# Patient Record
Sex: Female | Born: 1985
Health system: Southern US, Community
[De-identification: ages and names within clinical notes are randomized; demographics above are authoritative.]

## PROBLEM LIST (undated history)

## (undated) ENCOUNTER — Inpatient Hospital Stay (HOSPITAL_COMMUNITY): Payer: Self-pay

## (undated) DIAGNOSIS — Z98891 History of uterine scar from previous surgery: Secondary | ICD-10-CM

## (undated) DIAGNOSIS — R12 Heartburn: Secondary | ICD-10-CM

## (undated) DIAGNOSIS — J302 Other seasonal allergic rhinitis: Secondary | ICD-10-CM

## (undated) DIAGNOSIS — O26899 Other specified pregnancy related conditions, unspecified trimester: Secondary | ICD-10-CM

## (undated) DIAGNOSIS — J45909 Unspecified asthma, uncomplicated: Secondary | ICD-10-CM

---

## 2008-12-10 ENCOUNTER — Emergency Department (HOSPITAL_COMMUNITY): Admission: EM | Admit: 2008-12-10 | Discharge: 2008-12-10 | Payer: Self-pay | Admitting: Emergency Medicine

## 2010-09-11 ENCOUNTER — Other Ambulatory Visit (HOSPITAL_COMMUNITY): Payer: Self-pay | Admitting: Obstetrics and Gynecology

## 2010-09-11 DIAGNOSIS — Z3689 Encounter for other specified antenatal screening: Secondary | ICD-10-CM

## 2010-09-13 ENCOUNTER — Ambulatory Visit (HOSPITAL_COMMUNITY)
Admission: RE | Admit: 2010-09-13 | Discharge: 2010-09-13 | Disposition: A | Discharge status: Home / Self Care | Payer: Managed Care, Other (non HMO) | Source: Ambulatory Visit | Attending: Obstetrics and Gynecology | Admitting: Obstetrics and Gynecology

## 2010-09-13 ENCOUNTER — Ambulatory Visit (HOSPITAL_COMMUNITY): Payer: Self-pay

## 2010-09-13 DIAGNOSIS — Z3689 Encounter for other specified antenatal screening: Secondary | ICD-10-CM

## 2010-09-13 DIAGNOSIS — O358XX0 Maternal care for other (suspected) fetal abnormality and damage, not applicable or unspecified: Secondary | ICD-10-CM | POA: Insufficient documentation

## 2010-09-13 DIAGNOSIS — Z363 Encounter for antenatal screening for malformations: Secondary | ICD-10-CM | POA: Insufficient documentation

## 2010-09-13 DIAGNOSIS — Z1389 Encounter for screening for other disorder: Secondary | ICD-10-CM | POA: Insufficient documentation

## 2010-11-09 ENCOUNTER — Other Ambulatory Visit: Payer: Self-pay

## 2010-11-09 ENCOUNTER — Inpatient Hospital Stay (HOSPITAL_COMMUNITY)
Admission: AD | Admit: 2010-11-09 | Discharge: 2010-11-09 | Disposition: A | Discharge status: Home / Self Care | Payer: Managed Care, Other (non HMO) | Source: Ambulatory Visit | Attending: Obstetrics and Gynecology | Admitting: Obstetrics and Gynecology

## 2010-11-09 ENCOUNTER — Encounter (HOSPITAL_COMMUNITY): Payer: Self-pay

## 2010-11-09 DIAGNOSIS — O9989 Other specified diseases and conditions complicating pregnancy, childbirth and the puerperium: Secondary | ICD-10-CM

## 2010-11-09 DIAGNOSIS — R55 Syncope and collapse: Secondary | ICD-10-CM

## 2010-11-09 LAB — WET PREP, GENITAL
Clue Cells Wet Prep HPF POC: NONE SEEN
Trich, Wet Prep: NONE SEEN
Yeast Wet Prep HPF POC: NONE SEEN

## 2010-11-09 LAB — COMPREHENSIVE METABOLIC PANEL
ALT: 48 U/L — ABNORMAL HIGH (ref 0–35)
AST: 30 U/L (ref 0–37)
Albumin: 2.7 g/dL — ABNORMAL LOW (ref 3.5–5.2)
Alkaline Phosphatase: 65 U/L (ref 39–117)
BUN: 4 mg/dL — ABNORMAL LOW (ref 6–23)
CO2: 23 mEq/L (ref 19–32)
Calcium: 8.9 mg/dL (ref 8.4–10.5)
Chloride: 103 mEq/L (ref 96–112)
Creatinine, Ser: <0.47 mg/dL — ABNORMAL LOW (ref 0.50–1.10)
Glucose, Bld: 82 mg/dL (ref 70–99)
Potassium: 3.6 mEq/L (ref 3.5–5.1)
Sodium: 134 mEq/L — ABNORMAL LOW (ref 135–145)
Total Bilirubin: 0.2 mg/dL — ABNORMAL LOW (ref 0.3–1.2)
Total Protein: 6.8 g/dL (ref 6.0–8.3)

## 2010-11-09 LAB — CBC
HCT: 33.5 % — ABNORMAL LOW (ref 36.0–46.0)
Hemoglobin: 11.8 g/dL — ABNORMAL LOW (ref 12.0–15.0)
MCH: 31.1 pg (ref 26.0–34.0)
MCHC: 35.2 g/dL (ref 30.0–36.0)
MCV: 88.4 fL (ref 78.0–100.0)
Platelets: 234 10*3/uL (ref 150–400)
RBC: 3.79 MIL/uL — ABNORMAL LOW (ref 3.87–5.11)
RDW: 12.7 % (ref 11.5–15.5)
WBC: 9.7 10*3/uL (ref 4.0–10.5)

## 2010-11-09 NOTE — ED Provider Notes (Signed)
History     Chief Complaint  Patient presents with  . Loss of Consciousness    3 times in 3 days out for seconds has history  . Rupture of Membranes   HPIpt is 26.[redacted] weeks pregnant and complains of leaking clear fluid a couple of hours ago @ 1500.  Pt denies cramping.  Pt has also had 3 episodes of fainting or near fainting- not related to dehydration or not eating.      Past Medical History  Diagnosis Date  . No pertinent past medical history     Past Surgical History  Procedure Date  . No past surgeries     No family history on file.  History  Substance Use Topics  . Smoking status: Never Smoker   . Smokeless tobacco: Not on file  . Alcohol Use: No    Allergies: Allergies not on file  No prescriptions prior to admission    ROS Physical Exam   Blood pressure 117/84, pulse 93, temperature 98.3 F (36.8 C), temperature source Oral, resp. rate 18, height 5\' 3"  (1.6 m), weight 154 lb 12.8 oz (70.217 kg).  Physical Exam Pt alert and oriented in no acute distress. abd gravid, soft nontender  MAU Course  Procedures Pelvic exam performed- no pooling of fluid; small amount of white thick discharge in vault; cervix closed, clean.   Dr. Ellyn Hack called- since pt has had a history of syncope and near syncope, will do CBC, CMET and EKG EKG normal sinus rhythm Lab results reviewed with Dr. Ellyn Hack- ALT slightly elevated and will have pt have rechecked in the office Pt may be discharged hom MDM  Assessment and Plan  Pregnancy Syncope  Latonda Larrivee 11/09/2010, 5:54 PM

## 2010-11-09 NOTE — Progress Notes (Signed)
Pt presents to MAU with complaints of "passing out", and leakage of fluid. Pt states she has not felt like her self the past few days. States she has been nauseous, and today around 1500 she felt a leakage of fluid from her vagina.

## 2010-11-09 NOTE — Progress Notes (Signed)
Leaking clear fluid a couple of hours of ago, no cramping, sores on tongue, 3 episodes of fainting in past 3 days (has history of)

## 2011-01-15 LAB — STREP B DNA PROBE: GBS: NEGATIVE

## 2011-02-14 ENCOUNTER — Encounter (HOSPITAL_COMMUNITY): Payer: Self-pay | Admitting: *Deleted

## 2011-02-14 ENCOUNTER — Inpatient Hospital Stay (HOSPITAL_COMMUNITY)
Admission: AD | Admit: 2011-02-14 | Discharge: 2011-02-18 | DRG: 766 | Disposition: A | Discharge status: Home / Self Care | Payer: Managed Care, Other (non HMO) | Source: Ambulatory Visit | Attending: Obstetrics and Gynecology | Admitting: Obstetrics and Gynecology

## 2011-02-14 LAB — CBC
HCT: 39.8 % (ref 36.0–46.0)
Hemoglobin: 13.9 g/dL (ref 12.0–15.0)
MCH: 31.2 pg (ref 26.0–34.0)
MCHC: 34.9 g/dL (ref 30.0–36.0)
MCV: 89.2 fL (ref 78.0–100.0)
Platelets: 243 10*3/uL (ref 150–400)
RBC: 4.46 MIL/uL (ref 3.87–5.11)
RDW: 12.9 % (ref 11.5–15.5)
WBC: 8.5 10*3/uL (ref 4.0–10.5)

## 2011-02-14 LAB — HEPATITIS B SURFACE ANTIGEN: Hepatitis B Surface Ag: NEGATIVE

## 2011-02-14 LAB — ABO/RH: RH Type: POSITIVE

## 2011-02-14 LAB — RPR
RPR Ser Ql: NONREACTIVE
RPR: NONREACTIVE

## 2011-02-14 LAB — RUBELLA ANTIBODY, IGM: Rubella: IMMUNE

## 2011-02-14 LAB — ANTIBODY SCREEN: Antibody Screen: NEGATIVE

## 2011-02-14 LAB — HIV ANTIBODY (ROUTINE TESTING W REFLEX): HIV: NONREACTIVE

## 2011-02-14 MED ORDER — OXYTOCIN BOLUS FROM INFUSION
500.0000 mL | Freq: Once | INTRAVENOUS | Status: DC
  Filled 2011-02-14: qty 500
  Filled 2011-02-14: qty 1000

## 2011-02-14 MED ORDER — ONDANSETRON HCL 4 MG/2ML IJ SOLN: 4.0000 mg | Freq: Four times a day (QID) | INTRAMUSCULAR | Status: DC | PRN

## 2011-02-14 MED ORDER — CITRIC ACID-SODIUM CITRATE 334-500 MG/5ML PO SOLN
30.0000 mL | ORAL | Status: DC | PRN
  Administered 2011-02-15: 30 mL via ORAL
  Filled 2011-02-14: qty 15

## 2011-02-14 MED ORDER — OXYTOCIN 20 UNITS IN LACTATED RINGERS INFUSION - SIMPLE
1.0000 m[IU]/min | INTRAVENOUS | Status: DC
  Administered 2011-02-14: 1 m[IU]/min via INTRAVENOUS
  Administered 2011-02-15: 34 m[IU]/min via INTRAVENOUS
  Administered 2011-02-15: 30 m[IU]/min via INTRAVENOUS
  Administered 2011-02-15: 25 m[IU]/min via INTRAVENOUS

## 2011-02-14 MED ORDER — TERBUTALINE SULFATE 1 MG/ML IJ SOLN: 0.2500 mg | Freq: Once | INTRAMUSCULAR | Status: AC | PRN

## 2011-02-14 MED ORDER — LIDOCAINE HCL (PF) 1 % IJ SOLN: 30.0000 mL | INTRAMUSCULAR | Status: DC | PRN

## 2011-02-14 MED ORDER — FAMOTIDINE 20 MG PO TABS
20.0000 mg | ORAL_TABLET | Freq: Every day | ORAL | Status: DC
  Administered 2011-02-14 – 2011-02-18 (×3): 20 mg via ORAL
  Filled 2011-02-14 (×3): qty 1

## 2011-02-14 MED ORDER — LACTATED RINGERS IV SOLN
500.0000 mL | INTRAVENOUS | Status: DC | PRN
  Administered 2011-02-14: 500 mL via INTRAVENOUS
  Administered 2011-02-15: 1000 mL via INTRAVENOUS
  Administered 2011-02-15: 500 mL via INTRAVENOUS
  Administered 2011-02-15: 1000 mL via INTRAVENOUS

## 2011-02-14 MED ORDER — OXYTOCIN 20 UNITS IN LACTATED RINGERS INFUSION - SIMPLE: 125.0000 mL/h | Freq: Once | INTRAVENOUS | Status: DC

## 2011-02-14 MED ORDER — ACETAMINOPHEN 325 MG PO TABS: 650.0000 mg | ORAL_TABLET | ORAL | Status: DC | PRN

## 2011-02-14 MED ORDER — OXYTOCIN 20 UNITS IN LACTATED RINGERS INFUSION - SIMPLE
INTRAVENOUS | Status: AC
  Administered 2011-02-14: 1 m[IU]/min via INTRAVENOUS
  Filled 2011-02-14: qty 1000

## 2011-02-14 MED ORDER — IBUPROFEN 600 MG PO TABS
600.0000 mg | ORAL_TABLET | Freq: Four times a day (QID) | ORAL | Status: DC | PRN
  Administered 2011-02-16 (×2): 600 mg via ORAL
  Filled 2011-02-14 (×8): qty 1

## 2011-02-14 MED ORDER — OXYCODONE-ACETAMINOPHEN 5-325 MG PO TABS: 2.0000 | ORAL_TABLET | ORAL | Status: DC | PRN

## 2011-02-14 MED ORDER — FLEET ENEMA 7-19 GM/118ML RE ENEM: 1.0000 | ENEMA | RECTAL | Status: DC | PRN

## 2011-02-14 MED ORDER — LACTATED RINGERS IV SOLN
INTRAVENOUS | Status: DC
  Administered 2011-02-14 – 2011-02-15 (×4): via INTRAVENOUS

## 2011-02-14 NOTE — H&P (Signed)
NAME:  Alyssa Guerra, Alyssa Guerra             ACCOUNT NO.:  [id number]  MEDICAL RECORD NO.:  [id number]  LOCATION:  9166                          FACILITY:  [facility]  PHYSICIAN:  Malachi Pro. Ambrose Mantle, M.D. DATE OF BIRTH:  09/07/1985  DATE OF ADMISSION:  02/14/2011 DATE OF DISCHARGE:                             HISTORY & PHYSICAL   This is a 25 year old white female, para 0-0-0-0, gravida 1, EDC February 11, 2011, by her last menstrual period in February 13, 2011, by ultrasound done on July 10, 2010, at 8 weeks and 6 days.  This patient was seen in our office today for a nonstress test and the test was minimally reactive and each time she had any uterine activity, there was some form of a deceleration, so she was advised to come to the hospital for further evaluation and to proceed with delivery.  Her blood group and type was A positive.  Negative antibody.  Pap smear normal.  Rubella immune.  RPR nonreactive.  Urine culture negative.  Hepatitis B surface antigen negative.  HIV negative.  TSH 1.755.  Free T4 1.02.  Varicella IgG was immune.  GC and Chlamydia negative.  First trimester screen negative.  Cystic fibrosis screen negative.  MSAFP for neural tube defects was negative, 1-hour Glucola 103 and a group B strep was negative.  Her prenatal course has been essentially uncomplicated.  A second ultrasound done on Sep 13, 2010, showed an estimated gestational age of [redacted] weeks and 3 days with an [facility] of February 11, 2011.  PAST MEDICAL HISTORY:  She did have a history of a resolved ovarian cyst.  PAST SURGICAL HISTORY:  She has had no surgical procedures.  ALLERGIES:  No known allergies.  FAMILY HISTORY:  Her maternal grandmother had anxiety, CVA, diabetes and heart disease as well as a malignant brain tumor in the uterus.  A paternal grandfather had heart disease.  The patient states she has a bachelor's degree.  She denies alcohol, tobacco and drugs.  She does exercise on an elliptical.  She is  employed at [employer].  Dictation ended at this point.     Malachi Pro. Ambrose Mantle, M.D.     TFH/MEDQ  D:  02/14/2011  T:  02/14/2011  Job:  [id number]

## 2011-02-15 ENCOUNTER — Encounter (HOSPITAL_COMMUNITY)
Admission: AD | Disposition: A | Discharge status: Home / Self Care | Payer: Self-pay | Source: Ambulatory Visit | Attending: Obstetrics and Gynecology

## 2011-02-15 ENCOUNTER — Inpatient Hospital Stay (HOSPITAL_COMMUNITY): Payer: Managed Care, Other (non HMO) | Admitting: Anesthesiology

## 2011-02-15 ENCOUNTER — Other Ambulatory Visit: Payer: Self-pay | Admitting: Obstetrics and Gynecology

## 2011-02-15 ENCOUNTER — Encounter (HOSPITAL_COMMUNITY): Payer: Self-pay | Admitting: *Deleted

## 2011-02-15 ENCOUNTER — Encounter (HOSPITAL_COMMUNITY): Payer: Self-pay | Admitting: Anesthesiology

## 2011-02-15 SURGERY — Surgical Case
Anesthesia: Epidural | Site: Abdomen | Wound class: Clean Contaminated

## 2011-02-15 MED ORDER — OXYTOCIN 10 UNIT/ML IJ SOLN
INTRAMUSCULAR | Status: AC
  Filled 2011-02-15: qty 4

## 2011-02-15 MED ORDER — BUTORPHANOL TARTRATE 2 MG/ML IJ SOLN
1.0000 mg | Freq: Once | INTRAMUSCULAR | Status: AC
  Administered 2011-02-15: 1 mg via INTRAVENOUS

## 2011-02-15 MED ORDER — CEFAZOLIN SODIUM 1-5 GM-% IV SOLN
1.0000 g | Freq: Three times a day (TID) | INTRAVENOUS | Status: AC
  Administered 2011-02-16: 1 g via INTRAVENOUS
  Filled 2011-02-15 (×3): qty 50

## 2011-02-15 MED ORDER — LORATADINE 10 MG PO TABS
10.0000 mg | ORAL_TABLET | Freq: Every day | ORAL | Status: DC
  Administered 2011-02-15 – 2011-02-18 (×3): 10 mg via ORAL
  Filled 2011-02-15 (×4): qty 1

## 2011-02-15 MED ORDER — FENTANYL 2.5 MCG/ML BUPIVACAINE 1/10 % EPIDURAL INFUSION (WH - ANES)
14.0000 mL/h | INTRAMUSCULAR | Status: DC
  Administered 2011-02-15 (×2): 14 mL/h via EPIDURAL
  Filled 2011-02-15 (×2): qty 60

## 2011-02-15 MED ORDER — FENTANYL CITRATE 0.05 MG/ML IJ SOLN
INTRAMUSCULAR | Status: AC
  Filled 2011-02-15: qty 2

## 2011-02-15 MED ORDER — SODIUM CHLORIDE 0.9 % IR SOLN
Status: DC | PRN
  Administered 2011-02-15: 1000 mL

## 2011-02-15 MED ORDER — CEFAZOLIN SODIUM 1-5 GM-% IV SOLN
INTRAVENOUS | Status: AC
  Filled 2011-02-15: qty 100

## 2011-02-15 MED ORDER — EPHEDRINE 5 MG/ML INJ
10.0000 mg | INTRAVENOUS | Status: DC | PRN
  Filled 2011-02-15: qty 4

## 2011-02-15 MED ORDER — PHENYLEPHRINE 40 MCG/ML (10ML) SYRINGE FOR IV PUSH (FOR BLOOD PRESSURE SUPPORT)
PREFILLED_SYRINGE | INTRAVENOUS | Status: AC
  Filled 2011-02-15: qty 10

## 2011-02-15 MED ORDER — CEFAZOLIN SODIUM 1-5 GM-% IV SOLN
INTRAVENOUS | Status: DC | PRN
  Administered 2011-02-15: 2 g via INTRAVENOUS

## 2011-02-15 MED ORDER — ONDANSETRON HCL 4 MG/2ML IJ SOLN
INTRAMUSCULAR | Status: AC
  Filled 2011-02-15: qty 2

## 2011-02-15 MED ORDER — PHENYLEPHRINE 40 MCG/ML (10ML) SYRINGE FOR IV PUSH (FOR BLOOD PRESSURE SUPPORT)
80.0000 ug | PREFILLED_SYRINGE | INTRAVENOUS | Status: DC | PRN
  Filled 2011-02-15: qty 5

## 2011-02-15 MED ORDER — LIDOCAINE HCL 1.5 % IJ SOLN
INTRAMUSCULAR | Status: DC | PRN
  Administered 2011-02-15 (×2): 5 mL via INTRADERMAL
  Administered 2011-02-15: 2 mL via INTRADERMAL

## 2011-02-15 MED ORDER — PHENYLEPHRINE 40 MCG/ML (10ML) SYRINGE FOR IV PUSH (FOR BLOOD PRESSURE SUPPORT): 80.0000 ug | PREFILLED_SYRINGE | INTRAVENOUS | Status: DC | PRN

## 2011-02-15 MED ORDER — LACTATED RINGERS IV SOLN: 500.0000 mL | Freq: Once | INTRAVENOUS | Status: DC

## 2011-02-15 MED ORDER — ZOLPIDEM TARTRATE 10 MG PO TABS
10.0000 mg | ORAL_TABLET | Freq: Every evening | ORAL | Status: DC | PRN
  Administered 2011-02-15: 10 mg via ORAL
  Filled 2011-02-15: qty 1

## 2011-02-15 MED ORDER — MORPHINE SULFATE 0.5 MG/ML IJ SOLN
INTRAMUSCULAR | Status: AC
  Filled 2011-02-15: qty 10

## 2011-02-15 MED ORDER — MEPERIDINE HCL 25 MG/ML IJ SOLN
INTRAMUSCULAR | Status: DC | PRN
  Administered 2011-02-15: 25 mg via INTRAVENOUS

## 2011-02-15 MED ORDER — ONDANSETRON HCL 4 MG/2ML IJ SOLN
INTRAMUSCULAR | Status: DC | PRN
  Administered 2011-02-15: 4 mg via INTRAVENOUS

## 2011-02-15 MED ORDER — LACTATED RINGERS IV SOLN: INTRAVENOUS | Status: DC

## 2011-02-15 MED ORDER — KETOROLAC TROMETHAMINE 60 MG/2ML IM SOLN
60.0000 mg | Freq: Once | INTRAMUSCULAR | Status: AC
  Administered 2011-02-15: 60 mg via INTRAMUSCULAR

## 2011-02-15 MED ORDER — SCOPOLAMINE 1 MG/3DAYS TD PT72
MEDICATED_PATCH | TRANSDERMAL | Status: AC
  Filled 2011-02-15: qty 1

## 2011-02-15 MED ORDER — KETOROLAC TROMETHAMINE 60 MG/2ML IM SOLN
INTRAMUSCULAR | Status: AC
  Administered 2011-02-15: 60 mg via INTRAMUSCULAR
  Filled 2011-02-15: qty 2

## 2011-02-15 MED ORDER — SODIUM BICARBONATE 8.4 % IV SOLN
INTRAVENOUS | Status: DC | PRN
  Administered 2011-02-15: 5 mL via EPIDURAL

## 2011-02-15 MED ORDER — LACTATED RINGERS IV SOLN
20.0000 [IU] | INTRAVENOUS | Status: DC | PRN
  Administered 2011-02-15: 20 [IU] via INTRAVENOUS

## 2011-02-15 MED ORDER — DIPHENHYDRAMINE HCL 50 MG/ML IJ SOLN: 12.5000 mg | INTRAMUSCULAR | Status: DC | PRN

## 2011-02-15 MED ORDER — MORPHINE SULFATE (PF) 0.5 MG/ML IJ SOLN
INTRAMUSCULAR | Status: DC | PRN
  Administered 2011-02-15: 1 mg via INTRAVENOUS

## 2011-02-15 MED ORDER — LACTATED RINGERS IV SOLN
INTRAVENOUS | Status: DC | PRN
  Administered 2011-02-15 (×3): via INTRAVENOUS

## 2011-02-15 MED ORDER — MORPHINE SULFATE (PF) 0.5 MG/ML IJ SOLN
INTRAMUSCULAR | Status: DC | PRN
  Administered 2011-02-15: 4 mg via EPIDURAL

## 2011-02-15 MED ORDER — SCOPOLAMINE 1 MG/3DAYS TD PT72
1.0000 | MEDICATED_PATCH | TRANSDERMAL | Status: DC
  Administered 2011-02-15: 1.5 mg via TRANSDERMAL

## 2011-02-15 MED ORDER — FENTANYL CITRATE 0.05 MG/ML IJ SOLN
INTRAMUSCULAR | Status: DC | PRN
  Administered 2011-02-15: 100 ug via INTRAVENOUS

## 2011-02-15 MED ORDER — EPHEDRINE 5 MG/ML INJ: 10.0000 mg | INTRAVENOUS | Status: DC | PRN

## 2011-02-15 MED ORDER — BUTORPHANOL TARTRATE 2 MG/ML IJ SOLN
INTRAMUSCULAR | Status: AC
  Filled 2011-02-15: qty 1

## 2011-02-15 MED ORDER — MEPERIDINE HCL 25 MG/ML IJ SOLN
INTRAMUSCULAR | Status: AC
  Filled 2011-02-15: qty 1

## 2011-02-15 SURGICAL SUPPLY — 30 items
CLOTH BEACON ORANGE TIMEOUT ST (SAFETY) ×2 IMPLANT
CONTAINER PREFILL 10% NBF 15ML (MISCELLANEOUS) IMPLANT
DRESSING TELFA 8X3 (GAUZE/BANDAGES/DRESSINGS) IMPLANT
DRSG VASELINE 3X18 (GAUZE/BANDAGES/DRESSINGS) ×2 IMPLANT
ELECT REM PT RETURN 9FT ADLT (ELECTROSURGICAL) ×2
ELECTRODE REM PT RTRN 9FT ADLT (ELECTROSURGICAL) ×1 IMPLANT
EXTRACTOR VACUUM KIWI (MISCELLANEOUS) IMPLANT
EXTRACTOR VACUUM M CUP 4 TUBE (SUCTIONS) IMPLANT
GAUZE SPONGE 4X4 12PLY STRL LF (GAUZE/BANDAGES/DRESSINGS) ×4 IMPLANT
GLOVE BIO SURGEON STRL SZ7.5 (GLOVE) ×4 IMPLANT
GOWN PREVENTION PLUS LG XLONG (DISPOSABLE) ×4 IMPLANT
GOWN PREVENTION PLUS XLARGE (GOWN DISPOSABLE) ×2 IMPLANT
KIT ABG SYR 3ML LUER SLIP (SYRINGE) IMPLANT
NEEDLE HYPO 25X5/8 SAFETYGLIDE (NEEDLE) IMPLANT
NS IRRIG 1000ML POUR BTL (IV SOLUTION) ×2 IMPLANT
PACK C SECTION WH (CUSTOM PROCEDURE TRAY) ×2 IMPLANT
PAD ABD 7.5X8 STRL (GAUZE/BANDAGES/DRESSINGS) IMPLANT
RTRCTR C-SECT PINK 25CM LRG (MISCELLANEOUS) ×2 IMPLANT
SLEEVE SCD COMPRESS KNEE MED (MISCELLANEOUS) ×2 IMPLANT
STAPLER VISISTAT 35W (STAPLE) ×2 IMPLANT
SUT PLAIN 0 NONE (SUTURE) IMPLANT
SUT VIC AB 0 CT1 36 (SUTURE) ×14 IMPLANT
SUT VIC AB 3-0 CTX 36 (SUTURE) ×2 IMPLANT
SUT VIC AB 3-0 SH 27 (SUTURE)
SUT VIC AB 3-0 SH 27X BRD (SUTURE) IMPLANT
SUT VIC AB 4-0 KS 27 (SUTURE) IMPLANT
SUT VICRYL 0 TIES 12 18 (SUTURE) IMPLANT
TOWEL OR 17X24 6PK STRL BLUE (TOWEL DISPOSABLE) ×4 IMPLANT
TRAY FOLEY CATH 14FR (SET/KITS/TRAYS/PACK) ×2 IMPLANT
WATER STERILE IRR 1000ML POUR (IV SOLUTION) ×2 IMPLANT

## 2011-02-15 NOTE — Anesthesia Postprocedure Evaluation (Signed)
Anesthesia Post Note  Patient: Alyssa Guerra  Procedure(s) Performed:  CESAREAN SECTION - primary of baby boy  at 2054  APGAR 8/9  Anesthesia type: Epidural  Patient location: PACU  Post pain: Pain level controlled  Post assessment: Post-op Vital signs reviewed  Last Vitals:  Filed Vitals:   02/15/11 2230  BP: 117/85  Pulse: 87  Temp:   Resp: 18    Post vital signs: stable  Level of consciousness: awake  Complications: No apparent anesthesia complications

## 2011-02-15 NOTE — Op Note (Signed)
Operative note on Alyssa Guerra:   Date of the operation: 02/15/2011  Preoperative diagnosis: Failure to progress in labor  Postoperative diagnosis same  Operation: Low transverse cervical cesarean section  Operator: Ambrose Mantle  Anesthesia: Epidural  The patient was brought to the operating room and her epidural anesthetic was boosted. A Foley catheter was indwelling. After anesthesia was satisfactory, the abdomen was prepped with Betadine solution and draped as a sterile field. A timeout was done. Anesthesia was confirmed and a low transverse incision was made through the skin subcutaneous tissue and fascia and the rectus muscle was separated from the fascia superiorly and inferiorly. The peritoneum was opened and the incision was enlarged. An Alexis retractor Was placed in the peritoneal cavity exposing the lower uterine segment. An incision was made transversely in the lower uterine segment through the superficial layers of the myometrium. I entered the amniotic cavity with my finger and then pulled superiorly and inferiorly to enlarge the incision. It was noted that there were 2 loops of nuchal cord around the baby's neck. The fluid was dark meconium and I used the bulb and the DeLee to suction the baby's nose and pharynx. The cord was clamped and the infant was given to Dr. Mikle Bosworth who was in attendance. She assigned the Female infant Apgars of 8 at one and 9 at 5 minutes. The placenta was removed and it was noted to have multiple calcifications and the membranes were very stained with meconium. The inside of the uterus was  palpated and found to be free of any products of conception and the uterus was massaged and Pitocin was given to contract the uterus. The uterine incision was then closed with 2 running sutures of 0 Vicryl locking the first layer and nonlocking the second layer. Hemostasis was adequate. The uterus was normal and both tubes and ovaries were normal. The retractor was removed and  Liberal irrigation removed all blood and confirmed hemostasis. The abdominal wall was closed in layers using interrupted sutures of 0 Vicryl to reapproximate the peritoneum and the rectus muscle, 2 running sutures of 0 Vicryl on the fascia, a running 3-0 Vicryl on the subcutaneous tissue, and staples on the skin. The patient seemed to tolerate the procedure well and was returned to recovery in satisfactory condition estimated blood loss thousand cc sponge and needle counts correct

## 2011-02-15 NOTE — Anesthesia Preprocedure Evaluation (Addendum)
Anesthesia Evaluation  Patient identified by MRN, date of birth, ID band Patient awake  General Assessment Comment  Reviewed: Allergy & Precautions, H&P , NPO status , Patient's Chart, lab work & pertinent test results, reviewed documented beta blocker date and time   History of Anesthesia Complications Negative for: history of anesthetic complications  Airway Mallampati: I TM Distance: >3 FB Neck ROM: full    Dental  (+) Teeth Intact   Pulmonary  clear to auscultation        Cardiovascular regular Normal    Neuro/Psych Negative Neurological ROS  Negative Psych ROS   GI/Hepatic negative GI ROS Neg liver ROS    Endo/Other  Negative Endocrine ROS  Renal/GU negative Renal ROS     Musculoskeletal   Abdominal   Peds  Hematology negative hematology ROS (+)   Anesthesia Other Findings   Reproductive/Obstetrics (+) Pregnancy                           Anesthesia Physical Anesthesia Plan  ASA: II  Anesthesia Plan: Epidural   Post-op Pain Management:    Induction:   Airway Management Planned:   Additional Equipment:   Intra-op Plan:   Post-operative Plan:   Informed Consent: I have reviewed the patients History and Physical, chart, labs and discussed the procedure including the risks, benefits and alternatives for the proposed anesthesia with the patient or authorized representative who has indicated his/her understanding and acceptance.     Plan Discussed with:   Anesthesia Plan Comments:         Anesthesia Quick Evaluation

## 2011-02-15 NOTE — Anesthesia Procedure Notes (Signed)
Epidural Patient location during procedure: OB Start time: 02/15/2011 3:35 PM Reason for block: procedure for pain  Staffing Performed by: anesthesiologist   Preanesthetic Checklist Completed: patient identified, site marked, surgical consent, pre-op evaluation, timeout performed, IV checked, risks and benefits discussed and monitors and equipment checked  Epidural Patient position: sitting Prep: site prepped and draped and DuraPrep Patient monitoring: continuous pulse ox and blood pressure Approach: midline Injection technique: LOR air  Needle:  Needle type: Tuohy  Needle gauge: 17 G Needle length: 9 cm Needle insertion depth: 5 cm cm Catheter type: closed end flexible Catheter size: 19 Gauge Catheter at skin depth: 10 cm Test dose: negative  Assessment Events: blood not aspirated, injection not painful, no injection resistance, negative IV test and no paresthesia  Additional Notes Discussed risk of headache, infection, bleeding, nerve injury and failed or incomplete block.  Patient voices understanding and wishes to proceed.

## 2011-02-15 NOTE — Progress Notes (Signed)
Dr. Ambrose Mantle at bedside discussing plan of care. Discussing c-section due to failure to progress, explaining risks and benefits of c-section, pt verbalizes understanding and consents, FOB at bedside

## 2011-02-15 NOTE — Progress Notes (Signed)
Pt has an epidural so pain with contractions is not an issue. Pitocin is at 30 mu/ minute and the contractions are q 2-3 minutes. The cervix is 3-4 cm 60-70 % effaced and the vertex is at -2 station. Will continue to observe for progress now. FHR is acceptable.

## 2011-02-15 NOTE — Progress Notes (Signed)
  Pt is on 18 mu/minute of pitocin. She is having some contractions but she does not feel them. Cervix is long, posterior and closed.

## 2011-02-15 NOTE — Progress Notes (Signed)
o2 remains at 10 liters via face mask

## 2011-02-15 NOTE — Progress Notes (Signed)
Dr. Ambrose Mantle attempted AROM and noticed moderate Meconium stained fluid.  To document rupture time as 1210 per Dr. Ambrose Mantle

## 2011-02-15 NOTE — Transfer of Care (Signed)
Immediate Anesthesia Transfer of Care Note  Patient: Alyssa Guerra  Procedure(s) Performed:  CESAREAN SECTION - primary of baby boy  at 2054  APGAR 8/9  Patient Location: PACU  Anesthesia Type: Epidural  Level of Consciousness: awake, alert  and oriented  Airway & Oxygen Therapy: Patient Spontanous Breathing  Post-op Assessment: Report given to PACU RN and Post -op Vital signs reviewed and stable  Post vital signs: stable  Complications: No apparent anesthesia complications

## 2011-02-15 NOTE — Progress Notes (Signed)
Pitocin at 23 mu/ minute and contractions q 2-3 minutes. Cervix 2 cvm 50 % effaced and the vertex is at -2 station. AROM produced meconium  stained fluid.

## 2011-02-15 NOTE — Progress Notes (Signed)
Pt is on 34 mu/ minute of pitocin and her contractions are q 2-3 minutes. The cervix is 4 cm 60-70% effaced and the vertex is at -2 station. Will proceed with Csection for failure to progress in labor.

## 2011-02-15 NOTE — OR Nursing (Signed)
Fundal massage  By DLWegner RN

## 2011-02-16 LAB — CBC
HCT: 33.7 % — ABNORMAL LOW (ref 36.0–46.0)
Hemoglobin: 11.6 g/dL — ABNORMAL LOW (ref 12.0–15.0)
MCH: 30.9 pg (ref 26.0–34.0)
MCHC: 34.4 g/dL (ref 30.0–36.0)
MCV: 89.9 fL (ref 78.0–100.0)
Platelets: 196 10*3/uL (ref 150–400)
RBC: 3.75 MIL/uL — ABNORMAL LOW (ref 3.87–5.11)
RDW: 12.8 % (ref 11.5–15.5)
WBC: 14.3 10*3/uL — ABNORMAL HIGH (ref 4.0–10.5)

## 2011-02-16 LAB — ABO/RH: ABO/RH(D): A POS

## 2011-02-16 MED ORDER — DIBUCAINE 1 % RE OINT: 1.0000 "application " | TOPICAL_OINTMENT | RECTAL | Status: DC | PRN

## 2011-02-16 MED ORDER — OXYTOCIN 20 UNITS IN LACTATED RINGERS INFUSION - SIMPLE: 125.0000 mL/h | INTRAVENOUS | Status: AC

## 2011-02-16 MED ORDER — ONDANSETRON HCL 4 MG/2ML IJ SOLN: 4.0000 mg | INTRAMUSCULAR | Status: DC | PRN

## 2011-02-16 MED ORDER — MEASLES, MUMPS & RUBELLA VAC ~~LOC~~ INJ
0.5000 mL | INJECTION | Freq: Once | SUBCUTANEOUS | Status: DC
  Filled 2011-02-16: qty 0.5

## 2011-02-16 MED ORDER — MENTHOL 3 MG MT LOZG: 1.0000 | LOZENGE | OROMUCOSAL | Status: DC | PRN

## 2011-02-16 MED ORDER — LANOLIN HYDROUS EX OINT: 1.0000 "application " | TOPICAL_OINTMENT | CUTANEOUS | Status: DC | PRN

## 2011-02-16 MED ORDER — IBUPROFEN 600 MG PO TABS
600.0000 mg | ORAL_TABLET | Freq: Four times a day (QID) | ORAL | Status: DC
  Administered 2011-02-16 – 2011-02-18 (×8): 600 mg via ORAL
  Filled 2011-02-16 (×2): qty 1

## 2011-02-16 MED ORDER — TETANUS-DIPHTH-ACELL PERTUSSIS 5-2.5-18.5 LF-MCG/0.5 IM SUSP
0.5000 mL | Freq: Once | INTRAMUSCULAR | Status: AC
  Administered 2011-02-17: 0.5 mL via INTRAMUSCULAR
  Filled 2011-02-16: qty 0.5

## 2011-02-16 MED ORDER — ONDANSETRON HCL 4 MG PO TABS: 4.0000 mg | ORAL_TABLET | ORAL | Status: DC | PRN

## 2011-02-16 MED ORDER — SENNOSIDES-DOCUSATE SODIUM 8.6-50 MG PO TABS
2.0000 | ORAL_TABLET | Freq: Every day | ORAL | Status: DC
  Administered 2011-02-16 – 2011-02-17 (×2): 2 via ORAL

## 2011-02-16 MED ORDER — DIPHENHYDRAMINE HCL 25 MG PO CAPS
25.0000 mg | ORAL_CAPSULE | Freq: Four times a day (QID) | ORAL | Status: DC | PRN
  Administered 2011-02-16: 25 mg via ORAL
  Filled 2011-02-16: qty 1

## 2011-02-16 MED ORDER — SIMETHICONE 80 MG PO CHEW: 80.0000 mg | CHEWABLE_TABLET | ORAL | Status: DC | PRN

## 2011-02-16 MED ORDER — SIMETHICONE 80 MG PO CHEW
80.0000 mg | CHEWABLE_TABLET | Freq: Three times a day (TID) | ORAL | Status: DC
  Administered 2011-02-16 – 2011-02-18 (×6): 80 mg via ORAL

## 2011-02-16 MED ORDER — ZOLPIDEM TARTRATE 5 MG PO TABS: 5.0000 mg | ORAL_TABLET | Freq: Every evening | ORAL | Status: DC | PRN

## 2011-02-16 MED ORDER — WITCH HAZEL-GLYCERIN EX PADS: 1.0000 "application " | MEDICATED_PAD | CUTANEOUS | Status: DC | PRN

## 2011-02-16 MED ORDER — OXYCODONE-ACETAMINOPHEN 5-325 MG PO TABS
1.0000 | ORAL_TABLET | ORAL | Status: DC | PRN
  Administered 2011-02-16 – 2011-02-18 (×8): 1 via ORAL
  Filled 2011-02-16 (×8): qty 1

## 2011-02-16 NOTE — Progress Notes (Signed)
#  1 afebrile BP normal HGB stable Urine clear and yellow Abdomen soft and not tender.

## 2011-02-16 NOTE — Addendum Note (Signed)
Addendum  created 02/16/11 0736 by Fanny Dance   Modules edited:Notes Section

## 2011-02-16 NOTE — Anesthesia Postprocedure Evaluation (Signed)
  Anesthesia Post-op Note  Patient: Alyssa Guerra  Procedure(s) Performed:  CESAREAN SECTION - primary of baby boy  at 2054  APGAR 8/9  Patient Location: 117  Anesthesia Type: Epidural  Level of Consciousness: awake, alert  and oriented  Airway and Oxygen Therapy: Patient Spontanous Breathing  Post-op Pain: mild  Post-op Assessment: Patient's Cardiovascular Status Stable and Respiratory Function Stable  Post-op Vital Signs: stable  Complications: No apparent anesthesia complications

## 2011-02-17 MED ORDER — ACETAMINOPHEN FOR CIRCUMCISION 160 MG/5 ML
40.0000 mg | Freq: Once | ORAL | Status: DC | PRN
  Filled 2011-02-17: qty 0.4

## 2011-02-17 MED ORDER — SUCROSE 24% NICU/PEDS ORAL SOLUTION
1.0000 mL | OROMUCOSAL | Status: DC
  Filled 2011-02-17 (×2): qty 1

## 2011-02-17 MED ORDER — SUCROSE 24% NICU/PEDS ORAL SOLUTION
0.5000 mL | OROMUCOSAL | Status: DC
  Filled 2011-02-17 (×2): qty 0.5

## 2011-02-17 MED ORDER — ACETAMINOPHEN FOR CIRCUMCISION 160 MG/5 ML
40.0000 mg | Freq: Once | ORAL | Status: DC
  Filled 2011-02-17: qty 0.4

## 2011-02-17 MED ORDER — ACETAMINOPHEN FOR CIRCUMCISION 160 MG/5 ML
40.0000 mg | Freq: Once | ORAL | Status: AC | PRN
  Filled 2011-02-17: qty 0.4

## 2011-02-17 MED ORDER — EPINEPHRINE TOPICAL FOR CIRCUMCISION 0.1 MG/ML
1.0000 [drp] | TOPICAL | Status: DC | PRN
  Filled 2011-02-17: qty 0.05

## 2011-02-17 MED ORDER — LIDOCAINE 1%/NA BICARB 0.1 MEQ INJECTION
0.8000 mL | INJECTION | Freq: Once | INTRAVENOUS | Status: DC
  Filled 2011-02-17: qty 1

## 2011-02-17 NOTE — Progress Notes (Signed)
#  2 afebrile doing well.

## 2011-02-18 ENCOUNTER — Encounter (HOSPITAL_COMMUNITY): Payer: Self-pay | Admitting: Obstetrics and Gynecology

## 2011-02-18 MED ORDER — ACETAMINOPHEN FOR CIRCUMCISION 160 MG/5 ML: 40.0000 mg | Freq: Once | ORAL | Status: DC | PRN

## 2011-02-18 MED ORDER — EPINEPHRINE TOPICAL FOR CIRCUMCISION 0.1 MG/ML: 1.0000 [drp] | TOPICAL | Status: DC | PRN

## 2011-02-18 MED ORDER — LIDOCAINE 1%/NA BICARB 0.1 MEQ INJECTION: 0.8000 mL | INJECTION | Freq: Once | INTRAVENOUS | Status: DC

## 2011-02-18 MED ORDER — IBUPROFEN 600 MG PO TABS: 600.0000 mg | ORAL_TABLET | Freq: Four times a day (QID) | ORAL | Status: AC | PRN

## 2011-02-18 MED ORDER — SUCROSE 24% NICU/PEDS ORAL SOLUTION: 0.5000 mL | OROMUCOSAL | Status: DC

## 2011-02-18 MED ORDER — ACETAMINOPHEN FOR CIRCUMCISION 160 MG/5 ML: 40.0000 mg | Freq: Once | ORAL | Status: DC

## 2011-02-18 MED ORDER — OXYCODONE-ACETAMINOPHEN 5-325 MG PO TABS: 1.0000 | ORAL_TABLET | ORAL | Status: AC | PRN

## 2011-02-18 NOTE — Progress Notes (Signed)
#  3 afebrile BP normal staples out strips applied for D/C

## 2011-02-18 NOTE — Discharge Summary (Signed)
NAME:  Alyssa Guerra, Alyssa Guerra             ACCOUNT NO.:  [id number]  MEDICAL RECORD NO.:  [id number]  LOCATION:  9117                          FACILITY:  [facility]  PHYSICIAN:  Malachi Pro. Ambrose Mantle, M.D. DATE OF BIRTH:  01-26-1986  DATE OF ADMISSION:  02/14/2011 DATE OF DISCHARGE:  02/18/2011                              DISCHARGE SUMMARY   This is a 25 year old white female, para 0, gravida 1, who is admitted to the hospital from the office because of decelerations during a nonstress test.  Blood group and type A positive, negative antibody. Pap smear normal.  Rubella immune.  RPR nonreactive.  Urine culture negative.  Hepatitis B surface antigen negative.  HIV negative.  TSH normal.  Free T4 normal.  GC and Chlamydia negative.  First trimester screen negative.  Cystic fibrosis screen negative.  MSAFP negative.  1 hour Glucola 103 and group B strep negative.  After admission to the hospital, the patient was placed on Pitocin.  She finally became a fingertip dilated, and rupture of membranes, produced meconium-stained fluid.  She was able to get an epidural and at 5:33 p.m. was 3-4 cm dilated.  At 8:24 p.m. she was 4 cm and at 9 p.m. she had failed to progress beyond 4 cm in spite of 36 milliunits a minute of Pitocin and adequate contractions.  She underwent a low-transverse cervical C- section for failure to progress in labor.  Dr. Mikle Bosworth was in attendance and assigned the 6 pound 12 ounce female infant, Apgars of 8 at 1 minute and 9 at 5 minutes.  Postpartum, the patient did quite well and was discharged on the 3rd postpartum day.  Staples removed.  Strips were applied.  Initial hemoglobin was 13.9, hematocrit 39.8, white count 8500, platelet count 243,000.  Followup hemoglobin was 11.6, RPR was nonreactive.  The patient is ready for discharge.  She is passing flatus, tolerating a regular diet, ambulating well without difficulty. Her incision appears to be healing well.  DISCHARGE DIAGNOSES:   Intrauterine pregnancy, 40+ weeks, failure to progress in labor.  OPERATION:  Low-transverse cervical C-section.  FINAL CONDITION:  Improved.  INSTRUCTIONS:  Include regular discharge instruction booklet.  She will resume her Zyrtec.  She was given a prescription for Motrin 600 mg 30 tablets 1 every 6 hours as needed for pain and Percocet 5/325, 30 tablets, 1 every 6 hours as needed for pain.  She is to return to the office in 10 days for followup examination.     Malachi Pro. Ambrose Mantle, M.D.     TFH/MEDQ  D:  02/18/2011  T:  02/18/2011  Job:  [id number]

## 2011-02-18 NOTE — Progress Notes (Signed)
UR Chart review completed.  

## 2012-03-14 ENCOUNTER — Emergency Department (INDEPENDENT_AMBULATORY_CARE_PROVIDER_SITE_OTHER)
Admission: EM | Admit: 2012-03-14 | Discharge: 2012-03-14 | Disposition: A | Discharge status: Home / Self Care | Payer: Managed Care, Other (non HMO) | Source: Home / Self Care

## 2012-03-14 ENCOUNTER — Encounter (HOSPITAL_COMMUNITY): Payer: Self-pay

## 2012-03-14 DIAGNOSIS — J45901 Unspecified asthma with (acute) exacerbation: Secondary | ICD-10-CM

## 2012-03-14 MED ORDER — BECLOMETHASONE DIPROPIONATE 40 MCG/ACT IN AERS: 2.0000 | INHALATION_SPRAY | Freq: Two times a day (BID) | RESPIRATORY_TRACT | Status: DC

## 2012-03-14 MED ORDER — ALBUTEROL SULFATE (5 MG/ML) 0.5% IN NEBU
2.5000 mg | INHALATION_SOLUTION | Freq: Once | RESPIRATORY_TRACT | Status: AC
  Administered 2012-03-14: 2.5 mg via RESPIRATORY_TRACT

## 2012-03-14 MED ORDER — TRIAMCINOLONE ACETONIDE 40 MG/ML IJ SUSP
60.0000 mg | Freq: Once | INTRAMUSCULAR | Status: AC
  Administered 2012-03-14: 60 mg via INTRAMUSCULAR

## 2012-03-14 MED ORDER — TRIAMCINOLONE ACETONIDE 40 MG/ML IJ SUSP
INTRAMUSCULAR | Status: AC
  Filled 2012-03-14: qty 10

## 2012-03-14 MED ORDER — ALBUTEROL SULFATE (5 MG/ML) 0.5% IN NEBU
INHALATION_SOLUTION | RESPIRATORY_TRACT | Status: AC
  Filled 2012-03-14: qty 1

## 2012-03-14 MED ORDER — IPRATROPIUM BROMIDE 0.02 % IN SOLN
0.5000 mg | Freq: Once | RESPIRATORY_TRACT | Status: AC
  Administered 2012-03-14: 0.5 mg via RESPIRATORY_TRACT

## 2012-03-14 NOTE — ED Notes (Signed)
Improved after HHN treatment; discussed asthma plan

## 2012-03-14 NOTE — ED Provider Notes (Signed)
History     CSN: [id number]  Arrival date & time 03/14/12  1416   None     Chief Complaint  Patient presents with  . Asthma    (Consider location/radiation/quality/duration/timing/severity/associated sxs/prior treatment) HPI Comments: This 26 year old was doing generally well early this afternoon when she had acute onset of dyspnea and wheezing. She has a history of asthma and she uses an albuterol HFA. She used it did not improving she presents with moderate dyspnea stating that she cannot get a good breath. She is alert she is able to speak in short sentences and she has tachypnea. She denies any known areas of swelling or rash.  Patient is a 26 y.o. female presenting with asthma.  Asthma Associated symptoms include shortness of breath. Pertinent negatives include no chest pain.    Past Medical History  Diagnosis Date  . No pertinent past medical history     Past Surgical History  Procedure Date  . No past surgeries   . Cesarean section 02/15/2011    Procedure: CESAREAN SECTION;  Surgeon: Bing Plume, MD;  Location: [facility];  Service: Gynecology;  Laterality: N/A;  primary of baby boy  at 2054  APGAR 8/9    History reviewed. No pertinent family history.  History  Substance Use Topics  . Smoking status: Never Smoker   . Smokeless tobacco: Not on file  . Alcohol Use: No    OB History    Grav Para Term Preterm Abortions TAB SAB Ect Mult Living   1 1 1    0 0   1      Review of Systems  Constitutional: Positive for activity change. Negative for fever, diaphoresis and fatigue.  HENT: Positive for congestion and postnasal drip. Negative for sore throat, facial swelling, rhinorrhea, trouble swallowing, neck pain and neck stiffness.   Respiratory: Positive for cough, shortness of breath and wheezing. Negative for chest tightness.   Cardiovascular: Negative for chest pain and leg swelling.  Gastrointestinal: Negative.   Musculoskeletal: Negative.   Skin: Negative.    Neurological: Negative.   Psychiatric/Behavioral: Negative.     Allergies  Review of patient's allergies indicates no known allergies.  Home Medications   Current Outpatient Rx  Name  Route  Sig  Dispense  Refill  . BECLOMETHASONE DIPROPIONATE 40 MCG/ACT IN AERS   Inhalation   Inhale 2 puffs into the lungs 2 (two) times daily.   1 Inhaler   2   . CETIRIZINE HCL 10 MG PO TABS   Oral   Take 10 mg by mouth daily.           Marland Kitchen IRON-150 PO   Oral   Take 1 tablet by mouth.           Marland Kitchen PRENATAL PLUS 27-1 MG PO TABS   Oral   Take 1 tablet by mouth daily.             BP 127/76  Pulse 115  Temp 98.3 F (36.8 C) (Oral)  Resp 32  SpO2 92%  Physical Exam  Constitutional: She is oriented to person, place, and time. She appears well-developed and well-nourished. No distress.       She is remaining alert and oriented warm and dry the  HENT:  Mouth/Throat: Oropharynx is clear and moist. No oropharyngeal exudate.       Bilateral TMs are mildly retracted but no erythema or evidence of effusion  Eyes: EOM are normal. Pupils are equal, round, and reactive to light.  Neck: Normal range of motion. Neck supple.  Cardiovascular: Normal rate and normal heart sounds.   Pulmonary/Chest: She is in respiratory distress. She has wheezes. She has no rales.       Tachypnea. Bilateral lungs with I and O. wheezing with fair air movement.  Musculoskeletal: Normal range of motion. She exhibits no edema and no tenderness.  Lymphadenopathy:    She has no cervical adenopathy.  Neurological: She is alert and oriented to person, place, and time. No cranial nerve deficit.  Skin: Skin is warm and dry.  Psychiatric: She has a normal mood and affect.    ED Course  Procedures (including critical care time)  Labs Reviewed - No data to display No results found.   1. Asthma exacerbation       MDM  Duo neb  administered immediately upon arrival. Kenalog 60 mg IM ordered on arrival.  At  1445 hours she was reexamined. She states she still feels 100% better. She is no longer having shortness of breath or wheezing. She is smiling, alert and appears generally well. Auscultation reveals no wheezing, excellent air movement and inspiration equals expiration. She is asymptomatic with the exception of mild URI symptoms. She is to continue the Zyrtec daily Use the albuterol HFA as needed every 4 hours Add Qvar 40 mg 2 puffs twice a day She needs to find a PCP in the area as soon as she can. May return for any new symptoms problems or wheezing Discharged in stable condition with her husband.        Hayden Rasmussen, NP 03/14/12 [phone]

## 2012-03-14 NOTE — ED Notes (Signed)
Reportedly onset 2 h ago of worsening asthma syx , not responding to MDI albuterol; profuse wheezing bilaterally , R>L , able to speak in short sentences

## 2012-03-15 NOTE — ED Provider Notes (Signed)
Medical screening examination/treatment/procedure(s) were performed by resident physician or non-physician practitioner and as supervising physician I was immediately available for consultation/collaboration.   Barkley Bruns MD.    Linna Hoff, MD 03/15/12 1331

## 2012-10-30 ENCOUNTER — Inpatient Hospital Stay (HOSPITAL_COMMUNITY)
Admission: AD | Admit: 2012-10-30 | Discharge: 2012-10-30 | Disposition: A | Discharge status: Home / Self Care | Payer: 59 | Source: Ambulatory Visit | Attending: Obstetrics and Gynecology | Admitting: Obstetrics and Gynecology

## 2012-10-30 ENCOUNTER — Inpatient Hospital Stay (HOSPITAL_COMMUNITY): Payer: 59

## 2012-10-30 ENCOUNTER — Encounter (HOSPITAL_COMMUNITY): Payer: Self-pay | Admitting: *Deleted

## 2012-10-30 DIAGNOSIS — O209 Hemorrhage in early pregnancy, unspecified: Secondary | ICD-10-CM | POA: Insufficient documentation

## 2012-10-30 DIAGNOSIS — O4690 Antepartum hemorrhage, unspecified, unspecified trimester: Secondary | ICD-10-CM

## 2012-10-30 HISTORY — DX: Unspecified asthma, uncomplicated: J45.909

## 2012-10-30 LAB — CBC
HCT: 36.8 % (ref 36.0–46.0)
Hemoglobin: 12.6 g/dL (ref 12.0–15.0)
MCH: 29.4 pg (ref 26.0–34.0)
MCHC: 34.2 g/dL (ref 30.0–36.0)
MCV: 85.8 fL (ref 78.0–100.0)
Platelets: 269 10*3/uL (ref 150–400)
RBC: 4.29 MIL/uL (ref 3.87–5.11)
RDW: 12.8 % (ref 11.5–15.5)
WBC: 8.5 10*3/uL (ref 4.0–10.5)

## 2012-10-30 LAB — WET PREP, GENITAL
Clue Cells Wet Prep HPF POC: NONE SEEN
Trich, Wet Prep: NONE SEEN
WBC, Wet Prep HPF POC: NONE SEEN
Yeast Wet Prep HPF POC: NONE SEEN

## 2012-10-30 LAB — HCG, QUANTITATIVE, PREGNANCY: hCG, Beta Chain, Quant, S: 114462 m[IU]/mL — ABNORMAL HIGH (ref <5)

## 2012-10-30 NOTE — MAU Note (Signed)
SHE WENT TO DR MEISINGER'S OFFICE YESTERDAY  TO CONFIRM PREG,  -  DREW QUANT AND UPT-,  LMP-  UNSURE BECAUSE MIRENA OUT ON 5-8-  AND HAD BLEEDING AFTER- THEN NO MORE.    SHE DID HPT  THAT POSTIVE ON 6-28 AND ALSO ON 7-1  .PT SAYS SHE WAS AT WORK ON [employer]  TONIGHT  -  - STOOD UP-  BLOOD ON PANTS- WENT TO B-ROOM-- WITH LARGE AMT BLOOD AND SAW A CHERRY SIZE CLOT   THAT FELL INTO TOILET.    IN MAU- 1 SMALL STREAK OF RED BLOOD  ON PAD .   HAS  FELT BLOATED ALL DAY.   NO CRAMPS BEFORE OR AFTER.  AND BLOATED FEELING IS GONE.

## 2012-10-30 NOTE — MAU Provider Note (Signed)
History     CSN: [id number]  Arrival date and time: 10/30/12 4098   First Provider Initiated Contact with Patient 10/30/12 1939      No chief complaint on file.  HPI Ms. Alyssa Guerra is a 27 y.o. G2P1001 at Unknown who presents to MAU today with complaint of vaginal bleeding. 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 She noticed one small clot when she used the bathroom after this episode. She is still bleeding now, but lighter. She denies pain or fever. She has had occasional nausea without vomiting or diarrhea. She also states mild dysuria.   OB History   Grav Para Term Preterm Abortions TAB SAB Ect Mult Living   2 1 1    0 0   1      Past Medical History  Diagnosis Date  . No pertinent past medical history   . Asthma     Past Surgical History  Procedure Laterality Date  . No past surgeries    . Cesarean section  02/15/2011    Procedure: CESAREAN SECTION;  Surgeon: Bing Plume, MD;  Location: [facility];  Service: Gynecology;  Laterality: N/A;  primary of baby boy  at 2054  APGAR 8/9    History reviewed. No pertinent family history.  History  Substance Use Topics  . Smoking status: Never Smoker   . Smokeless tobacco: Not on file  . Alcohol Use: No    Allergies: No Known Allergies  Prescriptions prior to admission  Medication Sig Dispense Refill  . beclomethasone (QVAR) 40 MCG/ACT inhaler Inhale 2 puffs into the lungs 2 (two) times daily.  1 Inhaler  2  . cetirizine (ZYRTEC) 10 MG tablet Take 10 mg by mouth daily.        . Prenatal Vit-Fe Fumarate-FA (PRENATAL MULTIVITAMIN) TABS Take 1 tablet by mouth daily at 12 noon.        Review of Systems  Constitutional: Negative for fever and malaise/fatigue.  Gastrointestinal: Positive for nausea. Negative for vomiting, abdominal pain, diarrhea and constipation.  Genitourinary: Negative for dysuria, urgency  and frequency.       + vaginal bleeding  Neurological: Negative for dizziness, loss of consciousness and weakness.   Physical Exam   Blood pressure 120/70, pulse 74, temperature 98.8 F (37.1 C), temperature source Oral, height 5\' 3"  (1.6 m), weight 137 lb 6 oz (62.313 kg).  Physical Exam  Constitutional: She is oriented to person, place, and time. She appears well-developed and well-nourished. No distress.  HENT:  Head: Normocephalic and atraumatic.  Cardiovascular: Normal rate, regular rhythm and normal heart sounds.   Respiratory: Effort normal and breath sounds normal. No respiratory distress.  GI: Soft. Bowel sounds are normal. She exhibits no distension and no mass. There is no tenderness. There is no rebound and no guarding.  Genitourinary: Uterus is not enlarged and not tender. Cervix exhibits no motion tenderness, no discharge and no friability. Right adnexum displays no mass and no tenderness. Left adnexum displays no mass and no tenderness. There is bleeding (small amount of active bleeding with one small clot) around the vagina. No vaginal discharge found.  Neurological: She is alert and oriented to person, place, and time.  Skin: Skin is warm and dry. No erythema.  Psychiatric: She has a normal mood and affect.   No results found for this or any previous visit (from  the past 24 hour(s)).  MAU Course  Procedures None  MDM Discussed patient with Dr. Jackelyn Knife. Patient had quant hCG of 124000 in the office yesterday. Draw hCG, CBC today and get [place].  Blood type is A+ per previous visit in Epic 2000 - Care turned over to [facility], [place]  Freddi Starr, PA-C 10/30/2012 8:01 PM   Ultrasound: 1. Single live intrauterine gestation with an estimated  gestational age of [redacted] weeks and 0 days.  2. Moderate sized subchorionic hemorrhage.   Assessment and Plan  Bleeding in Early Pregnancy Viable Intrauterine Pregnancy  Plan: Consulted with Dr. Jackelyn Knife > reviewed  ultrasound results  DC to home Bleeding precautions.  Pelvic rest. [facility]

## 2012-11-03 ENCOUNTER — Inpatient Hospital Stay (HOSPITAL_COMMUNITY)
Admission: AD | Admit: 2012-11-03 | Discharge: 2012-11-03 | Disposition: A | Discharge status: Home / Self Care | Payer: 59 | Source: Ambulatory Visit | Attending: Obstetrics and Gynecology | Admitting: Obstetrics and Gynecology

## 2012-11-03 ENCOUNTER — Encounter (HOSPITAL_COMMUNITY): Payer: Self-pay | Admitting: *Deleted

## 2012-11-03 ENCOUNTER — Inpatient Hospital Stay (HOSPITAL_COMMUNITY): Payer: 59

## 2012-11-03 DIAGNOSIS — O418X10 Other specified disorders of amniotic fluid and membranes, first trimester, not applicable or unspecified: Secondary | ICD-10-CM

## 2012-11-03 DIAGNOSIS — R109 Unspecified abdominal pain: Secondary | ICD-10-CM | POA: Insufficient documentation

## 2012-11-03 DIAGNOSIS — O209 Hemorrhage in early pregnancy, unspecified: Secondary | ICD-10-CM | POA: Insufficient documentation

## 2012-11-03 LAB — CBC
HCT: 34.7 % — ABNORMAL LOW (ref 36.0–46.0)
Hemoglobin: 11.9 g/dL — ABNORMAL LOW (ref 12.0–15.0)
MCH: 29.5 pg (ref 26.0–34.0)
MCHC: 34.3 g/dL (ref 30.0–36.0)
MCV: 86.1 fL (ref 78.0–100.0)
Platelets: 259 10*3/uL (ref 150–400)
RBC: 4.03 MIL/uL (ref 3.87–5.11)
RDW: 12.6 % (ref 11.5–15.5)
WBC: 9 10*3/uL (ref 4.0–10.5)

## 2012-11-03 NOTE — MAU Note (Signed)
Since 1730, 2 pads with large amt in toilet, 3rd pad on , know sub cor hemorrhage. Was at work today and starting bleeding

## 2012-11-03 NOTE — MAU Note (Signed)
Patient states she has been having bleeding since being seen on 7-12. States she passed a palm size clot today with mild cramping.

## 2012-11-03 NOTE — MAU Provider Note (Signed)
History     CSN: [id number]  Arrival date and time: 11/03/12 4782   First Provider Initiated Contact with Patient 11/03/12 1821      Chief Complaint  Patient presents with  . Vaginal Bleeding  . Abdominal Cramping   HPI Alyssa Guerra is a 27 y.o. G2P1001 at [redacted]w[redacted]d who presents to MAU today with complaint of vaginal bleeding. The patient was seen on Saturday with new onset bleeding at that time. She has a known subchorionic hemorrhage and living IUP at that time. She has had steady light bleeding since then as well as "a gush" on Monday night and again just prior to coming in today. She states that she was concerned because she passed a very large "clot or something." She states mild cramping then and no pain now. She has had nausea without vomiting. She endorses fatigue, dizziness and weakness although all are minimal. She denies fever or UTI symptoms today.   OB History   Grav Para Term Preterm Abortions TAB SAB Ect Mult Living   2 1 1    0 0   1      Past Medical History  Diagnosis Date  . No pertinent past medical history   . Asthma     Past Surgical History  Procedure Laterality Date  . No past surgeries    . Cesarean section  02/15/2011    Procedure: CESAREAN SECTION;  Surgeon: Bing Plume, MD;  Location: [facility];  Service: Gynecology;  Laterality: N/A;  primary of baby boy  at 2054  APGAR 8/9    Family History  Problem Relation Age of Onset  . Diabetes Maternal Grandmother   . Cancer Maternal Grandmother   . Diabetes Maternal Grandfather     History  Substance Use Topics  . Smoking status: Never Smoker   . Smokeless tobacco: Not on file  . Alcohol Use: No    Allergies: No Known Allergies  No prescriptions prior to admission    Review of Systems  Constitutional: Positive for malaise/fatigue. Negative for fever.  Gastrointestinal: Positive for nausea and abdominal pain. Negative for vomiting.  Genitourinary: Negative for dysuria, urgency and  frequency.       + vaginal bleeding  Neurological: Positive for dizziness and weakness. Negative for loss of consciousness.   Physical Exam   Blood pressure 105/58, pulse 81, temperature 98.3 F (36.8 C), temperature source Oral, resp. rate 18, height 5\' 3"  (1.6 m), weight 138 lb (62.596 kg), SpO2 100.00%.  Physical Exam  Constitutional: She is oriented to person, place, and time. She appears well-developed and well-nourished. No distress.  HENT:  Head: Normocephalic and atraumatic.  Cardiovascular: Normal rate, regular rhythm and normal heart sounds.   Respiratory: Effort normal and breath sounds normal. No respiratory distress.  GI: Soft. Bowel sounds are normal. She exhibits no distension and no mass. There is no tenderness. There is no rebound and no guarding.  Genitourinary: There is bleeding (small amount of blood noted in the vaginal vault) around the vagina.  Neurological: She is alert and oriented to person, place, and time.  Skin: Skin is warm and dry. No erythema.  Psychiatric: She has a normal mood and affect.   Results for orders placed during the hospital encounter of 11/03/12 (from the past 48 hour(s))  CBC     Status: Abnormal   Collection Time    11/03/12  6:45 PM      Result Value Range   WBC 9.0  4.0 -  10.5 K/uL   RBC 4.03  3.87 - 5.11 MIL/uL   Hemoglobin 11.9 (*) 12.0 - 15.0 g/dL   HCT 16.1 (*) 09.6 - 04.5 %   MCV 86.1  78.0 - 100.0 fL   MCH 29.5  26.0 - 34.0 pg   MCHC 34.3  30.0 - 36.0 g/dL   RDW 40.9  81.1 - 91.4 %   Platelets 259  150 - 400 K/uL   US Ob Transvaginal  11/03/2012   *RADIOLOGY REPORT*  Clinical Data: Increased vaginal bleeding.  OBSTETRIC <14 WK ULTRASOUND TRANSVAGINAL  Technique:  Transabdominal ultrasound was performed for evaluation of the gestation as well as the maternal uterus and adnexal regions.  Comparison:  10/30/2012  Intrauterine gestational sac: Visualized/normal in shape. Yolk sac: Present Embryo: Present Cardiac Activity:  Present Heart Rate: 152 bpm  CRL:  13 mm  7 w  4 d         [place] EDC: 06/18/2013  Maternal uterus/Adnexae: Again noted is an intrauterine pregnancy.  There is a heterogeneous structure adjacent to the gestational sac which is suggestive for a subchorionic hemorrhage.  This collection roughly measures 2.6 x 2.1 x 2.7 cm. This collection was more echogenic on the previous examination and measured 3.0 x 2.1 x 3.3 cm.  Left ovary has a normal appearance and measures 4.0 x 2.2 x 2.6 cm.  Normal appearance of the right ovary measuring 4.1 x 2.0 x 3.7 cm.  IMPRESSION: Single live intrauterine pregnancy.  Gestational age by ultrasound is 7 weeks, 4 days  Heterogeneous structure adjacent to the gestational sac is most compatible with a subchorionic hemorrhage. The subchorionic hemorrhage has slightly decreased in size and more heterogeneous compared to the prior examination.   Original Report Authenticated By: Richarda Overlie, M.D.     MAU Course  Procedures None  MDM Discussed patient with Dr. Ambrose Mantle. Get [place] to confirm living IUP vs SAB. If living IUP continue with bleeding precautions, pelvic rest and follow-up in the office.   Assessment and Plan  A: SIUP at [redacted]w[redacted]d without normal FHR Subchorionic hemorrhage  P: Discharge home Bleeding precautions and pelvic rest discussed Patient advised to keep scheduled follow-up with [facility] Patient may return to MAU as needed or if her condition were to change or worsen  Freddi Starr, PA-C  11/05/2012, 8:16 AM

## 2012-11-25 LAB — OB RESULTS CONSOLE HEPATITIS B SURFACE ANTIGEN: Hepatitis B Surface Ag: NEGATIVE

## 2012-12-16 ENCOUNTER — Inpatient Hospital Stay (HOSPITAL_COMMUNITY): Admission: AD | Admit: 2012-12-16 | Payer: 59 | Source: Ambulatory Visit | Admitting: Obstetrics and Gynecology

## 2013-05-21 ENCOUNTER — Encounter (HOSPITAL_COMMUNITY): Payer: Self-pay | Admitting: Emergency Medicine

## 2013-05-21 ENCOUNTER — Emergency Department (HOSPITAL_COMMUNITY)
Admission: EM | Admit: 2013-05-21 | Discharge: 2013-05-21 | Disposition: A | Discharge status: Home / Self Care | Payer: 59 | Source: Home / Self Care | Attending: Emergency Medicine | Admitting: Emergency Medicine

## 2013-05-21 DIAGNOSIS — R6889 Other general symptoms and signs: Secondary | ICD-10-CM

## 2013-05-21 MED ORDER — OSELTAMIVIR PHOSPHATE 75 MG PO CAPS: 75.0000 mg | ORAL_CAPSULE | Freq: Two times a day (BID) | ORAL | Status: DC

## 2013-05-21 NOTE — ED Provider Notes (Signed)
CSN: [id number]     Arrival date & time 05/21/13  1225 History   First MD Initiated Contact with Patient 05/21/13 1309     Chief Complaint  Patient presents with  . Generalized Body Aches   HPI: Patient is a 28 y.o. female presenting with flu symptoms. The history is provided by the patient.  Influenza Presenting symptoms: fatigue, myalgias and rhinorrhea   Presenting symptoms: no cough, no diarrhea, no nausea and no vomiting   Severity:  Mild Onset quality:  Sudden Progression:  Unchanged Chronicity:  New Relieved by:  None tried Worsened by:  Nothing tried Ineffective treatments:  None tried Associated symptoms: chills and nasal congestion   Risk factors: pregnancy   Pt reports 24 hour h/o extreme malaise, body aches, chills and congestion. No known fever. Denies cough, N/V/D or other c/o's. Pt states she feels like she is getting the flu and wanted to catch it early. She was exposed to a pt w/ the flu approx 1 week ago and is [redacted] weeks pregnant and knows this is different from the normal fatigue in late pregnancy.   Past Medical History  Diagnosis Date  . No pertinent past medical history   . Asthma    Past Surgical History  Procedure Laterality Date  . No past surgeries    . Cesarean section  02/15/2011    Procedure: CESAREAN SECTION;  Surgeon: Melina Schools, MD;  Location: [facility];  Service: Gynecology;  Laterality: N/A;  primary of baby boy  at 2054  APGAR 8/9   Family History  Problem Relation Age of Onset  . Diabetes Maternal Grandmother   . Cancer Maternal Grandmother   . Diabetes Maternal Grandfather    History  Substance Use Topics  . Smoking status: Never Smoker   . Smokeless tobacco: Not on file  . Alcohol Use: No   OB History   Grav Para Term Preterm Abortions TAB SAB Ect Mult Living   2 1 1    0 0   1     Review of Systems  Constitutional: Positive for chills and fatigue.  HENT: Positive for congestion and rhinorrhea.   Eyes: Negative.    Respiratory: Negative.  Negative for cough.   Cardiovascular: Negative.   Gastrointestinal: Negative.  Negative for nausea, vomiting, abdominal pain and diarrhea.  Endocrine: Negative.   Genitourinary: Negative.   Musculoskeletal: Positive for myalgias.  Skin: Negative.   Allergic/Immunologic: Negative.   Neurological: Negative.   Hematological: Negative.   Psychiatric/Behavioral: Negative.     Allergies  Review of patient's allergies indicates no known allergies.  Home Medications   Current Outpatient Rx  Name  Route  Sig  Dispense  Refill  . acetaminophen (TYLENOL) 500 MG tablet   Oral   Take 500 mg by mouth every 6 (six) hours as needed for pain (For headache.).         Marland Kitchen beclomethasone (QVAR) 40 MCG/ACT inhaler   Inhalation   Inhale 2 puffs into the lungs 2 (two) times daily.   1 Inhaler   2   . cetirizine (ZYRTEC) 10 MG tablet   Oral   Take 10 mg by mouth daily.           Marland Kitchen oseltamivir (TAMIFLU) 75 MG capsule   Oral   Take 1 capsule (75 mg total) by mouth every 12 (twelve) hours.   10 capsule   0   . Prenatal Vit-Fe Fumarate-FA (PRENATAL MULTIVITAMIN) TABS   Oral   Take 1  tablet by mouth daily at 12 noon.          BP 129/81  Pulse 89  Resp 20  SpO2 98% Physical Exam  Constitutional: She is oriented to person, place, and time. She appears well-developed and well-nourished.  HENT:  Head: Normocephalic and atraumatic.  Right Ear: Tympanic membrane, external ear and ear canal normal.  Left Ear: Tympanic membrane, external ear and ear canal normal.  Nose: Nose normal. Right sinus exhibits no maxillary sinus tenderness and no frontal sinus tenderness. Left sinus exhibits no maxillary sinus tenderness and no frontal sinus tenderness.  Mouth/Throat: Uvula is midline, oropharynx is clear and moist and mucous membranes are normal.  Eyes: Conjunctivae are normal.  Neck: Neck supple.  Cardiovascular: Normal rate and regular rhythm.   Pulmonary/Chest:  Effort normal and breath sounds normal.  Neurological: She is alert and oriented to person, place, and time.  Skin: Skin is warm and dry.  Psychiatric: She has a normal mood and affect.    ED Course  Procedures (including critical care time) Labs Review Labs Reviewed - No data to display Imaging Review No results found.    MDM   1. Flu-like symptoms    24 hour h/o flu-like symptoms. Pt receptive to Tamiflu. Tamiflu 75 mg x 5 days. Encouraged rest, fluids and MD f/u as scheduled. Pt agreeable.     Jeryl Columbia, NP 05/21/13 1358

## 2013-05-21 NOTE — Discharge Instructions (Signed)
Rest, stay well hydrated and take Tamiflu as directed until completed. Tylenol and or Ibuprofen for bodyaches.   Influenza, Adult Influenza ("the flu") is a viral infection of the respiratory tract. It occurs more often in winter months because people spend more time in close contact with one another. Influenza can make you feel very sick. Influenza easily spreads from person to person (contagious). CAUSES  Influenza is caused by a virus that infects the respiratory tract. You can catch the virus by breathing in droplets from an infected person's cough or sneeze. You can also catch the virus by touching something that was recently contaminated with the virus and then touching your mouth, nose, or eyes. SYMPTOMS  Symptoms typically last 4 to 10 days and may include:  Fever.  Chills.  Headache, body aches, and muscle aches.  Sore throat.  Chest discomfort and cough.  Poor appetite.  Weakness or feeling tired.  Dizziness.  Nausea or vomiting. DIAGNOSIS  Diagnosis of influenza is often made based on your history and a physical exam. A nose or throat swab test can be done to confirm the diagnosis. RISKS AND COMPLICATIONS You may be at risk for a more severe case of influenza if you smoke cigarettes, have diabetes, have chronic heart disease (such as heart failure) or lung disease (such as asthma), or if you have a weakened immune system. Elderly people and pregnant women are also at risk for more serious infections. The most common complication of influenza is a lung infection (pneumonia). Sometimes, this complication can require emergency medical care and may be life-threatening. PREVENTION  An annual influenza vaccination (flu shot) is the best way to avoid getting influenza. An annual flu shot is now routinely recommended for all adults in the [place] TREATMENT  In mild cases, influenza goes away on its own. Treatment is directed at relieving symptoms. For more severe cases, your  caregiver may prescribe antiviral medicines to shorten the sickness. Antibiotic medicines are not effective, because the infection is caused by a virus, not by bacteria. HOME CARE INSTRUCTIONS  Only take over-the-counter or prescription medicines for pain, discomfort, or fever as directed by your caregiver.  Use a cool mist humidifier to make breathing easier.  Get plenty of rest until your temperature returns to normal. This usually takes 3 to 4 days.  Drink enough fluids to keep your urine clear or pale yellow.  Cover your mouth and nose when coughing or sneezing, and wash your hands well to avoid spreading the virus.  Stay home from work or school until your fever has been gone for at least 1 full day. SEEK MEDICAL CARE IF:   You have chest pain or a deep cough that worsens or produces more mucus.  You have nausea, vomiting, or diarrhea. SEEK IMMEDIATE MEDICAL CARE IF:   You have difficulty breathing, shortness of breath, or your skin or nails turn bluish.  You have severe neck pain or stiffness.  You have a severe headache, facial pain, or earache.  You have a worsening or recurring fever.  You have nausea or vomiting that cannot be controlled. MAKE SURE YOU:  Understand these instructions.  Will watch your condition.  Will get help right away if you are not doing well or get worse. Document Released: 04/04/2000 Document Revised: 10/07/2011 Document Reviewed: 07/07/2011 [facility] Patient Information 2014 [place], [place].

## 2013-05-21 NOTE — ED Notes (Signed)
Pt  Reports  Symptoms  Of  Body  Aches          Nasal  Congestion          General  Fatigue      With  The     Symptoms  Started  Yesterday       Pt  Is  A  [employer] employee    Who    Is   26  Weeks  P[regnant  And  Was  Exposed  To  The  Flu

## 2013-05-22 NOTE — ED Provider Notes (Signed)
Medical screening examination/treatment/procedure(s) were performed by non-physician practitioner and as supervising physician I was immediately available for consultation/collaboration.  Philipp Deputy, M.D.   Harden Mo, MD 05/22/13 1440

## 2013-06-03 ENCOUNTER — Encounter (HOSPITAL_COMMUNITY): Payer: Self-pay | Admitting: *Deleted

## 2013-06-03 ENCOUNTER — Inpatient Hospital Stay (HOSPITAL_COMMUNITY)
Admission: AD | Admit: 2013-06-03 | Discharge: 2013-06-03 | Disposition: A | Discharge status: Home / Self Care | Payer: 59 | Source: Ambulatory Visit | Attending: Obstetrics and Gynecology | Admitting: Obstetrics and Gynecology

## 2013-06-03 DIAGNOSIS — O99891 Other specified diseases and conditions complicating pregnancy: Secondary | ICD-10-CM | POA: Insufficient documentation

## 2013-06-03 DIAGNOSIS — O479 False labor, unspecified: Secondary | ICD-10-CM | POA: Insufficient documentation

## 2013-06-03 DIAGNOSIS — O9989 Other specified diseases and conditions complicating pregnancy, childbirth and the puerperium: Principal | ICD-10-CM

## 2013-06-03 NOTE — MAU Note (Addendum)
PT SAYS SHE THINKS   SROM AT 0140.   SAYS VE ON Tuesday- CLOSED.    DENIES HSV AND MRSA,

## 2013-06-03 NOTE — MAU Provider Note (Signed)
Ms. Alyssa Guerra is a 28 y.o. G2P1001 at [redacted]w[redacted]d who presents to MAU today with complaint of possible ROM. The patient states that she woke up to go to the bathroom around 0130 and afterwards noted a few small gushes of fluid. She states occasional contractions. She denies vaginal bleeding. She reports good fetal movement.   BP 120/86  Pulse 81  Temp(Src) 97.8 F (36.6 C) (Oral)  Resp 20  Ht 5\' 2"  (1.575 m)  Wt 179 lb (81.194 kg)  BMI 32.73 kg/m2 GENERAL: Well-developed, well-nourished female in no acute distress.  HEENT: Normocephalic, atraumatic.  LUNGS: Normal effort HEART: Regular rate  ABDOMEN: Soft, nontender. Gravid uterus PELVIC: Normal external female genitalia. Vagina is pink and rugated.  Normal discharge. No Pooling.  EXTREMITIES: No cyanosis, clubbing, or edema Dilation: Closed Cervical Position: Posterior Exam by:: Kaycen Whitworth, PA  Fern - Negative for ROM  A: Membranes intact  P: Report given to RN to contact Dr. Ulanda Edison for further instructions Medical screening exam complete  Farris Has, PA-C 06/03/2013 3:44 AM

## 2013-06-05 ENCOUNTER — Encounter (HOSPITAL_COMMUNITY): Payer: Self-pay | Admitting: *Deleted

## 2013-06-05 ENCOUNTER — Inpatient Hospital Stay (HOSPITAL_COMMUNITY)
Admission: AD | Admit: 2013-06-05 | Discharge: 2013-06-05 | Disposition: A | Discharge status: Home / Self Care | Payer: 59 | Source: Ambulatory Visit | Attending: Obstetrics and Gynecology | Admitting: Obstetrics and Gynecology

## 2013-06-05 DIAGNOSIS — K529 Noninfective gastroenteritis and colitis, unspecified: Secondary | ICD-10-CM

## 2013-06-05 DIAGNOSIS — R197 Diarrhea, unspecified: Secondary | ICD-10-CM | POA: Insufficient documentation

## 2013-06-05 DIAGNOSIS — O479 False labor, unspecified: Secondary | ICD-10-CM | POA: Insufficient documentation

## 2013-06-05 DIAGNOSIS — K5289 Other specified noninfective gastroenteritis and colitis: Secondary | ICD-10-CM

## 2013-06-05 DIAGNOSIS — O212 Late vomiting of pregnancy: Secondary | ICD-10-CM | POA: Insufficient documentation

## 2013-06-05 LAB — URINALYSIS, ROUTINE W REFLEX MICROSCOPIC
Bilirubin Urine: NEGATIVE
Glucose, UA: NEGATIVE mg/dL
Hgb urine dipstick: NEGATIVE
Ketones, ur: NEGATIVE mg/dL
Leukocytes, UA: NEGATIVE
Nitrite: NEGATIVE
Protein, ur: NEGATIVE mg/dL
Specific Gravity, Urine: 1.02 (ref 1.005–1.030)
Urobilinogen, UA: 0.2 mg/dL (ref 0.0–1.0)
pH: 7 (ref 5.0–8.0)

## 2013-06-05 MED ORDER — ONDANSETRON HCL 8 MG PO TABS: 8.0000 mg | ORAL_TABLET | Freq: Three times a day (TID) | ORAL | Status: DC | PRN

## 2013-06-05 MED ORDER — ONDANSETRON 8 MG PO TBDP
8.0000 mg | ORAL_TABLET | Freq: Once | ORAL | Status: AC
  Administered 2013-06-05: 8 mg via ORAL
  Filled 2013-06-05: qty 1

## 2013-06-05 NOTE — Progress Notes (Signed)
efm d/ced. FHR reactive

## 2013-06-05 NOTE — Discharge Instructions (Signed)
Viral Gastroenteritis Viral gastroenteritis is also known as stomach flu. This condition affects the stomach and intestinal tract. It can cause sudden diarrhea and vomiting. The illness typically lasts 3 to 8 days. Most people develop an immune response that eventually gets rid of the virus. While this natural response develops, the virus can make you quite ill. CAUSES  Many different viruses can cause gastroenteritis, such as rotavirus or noroviruses. You can catch one of these viruses by consuming contaminated food or water. You may also catch a virus by sharing utensils or other personal items with an infected person or by touching a contaminated surface. SYMPTOMS  The most common symptoms are diarrhea and vomiting. These problems can cause a severe loss of body fluids (dehydration) and a body salt (electrolyte) imbalance. Other symptoms may include:  Fever.  Headache.  Fatigue.  Abdominal pain. DIAGNOSIS  Your caregiver can usually diagnose viral gastroenteritis based on your symptoms and a physical exam. A stool sample may also be taken to test for the presence of viruses or other infections. TREATMENT  This illness typically goes away on its own. Treatments are aimed at rehydration. The most serious cases of viral gastroenteritis involve vomiting so severely that you are not able to keep fluids down. In these cases, fluids must be given through an intravenous line (IV). HOME CARE INSTRUCTIONS   Drink enough fluids to keep your urine clear or pale yellow. Drink small amounts of fluids frequently and increase the amounts as tolerated.  Ask your caregiver for specific rehydration instructions.  Avoid:  Foods high in sugar.  Alcohol.  Carbonated drinks.  Tobacco.  Juice.  Caffeine drinks.  Extremely hot or cold fluids.  Fatty, greasy foods.  Too much intake of anything at one time.  Dairy products until 24 to 48 hours after diarrhea stops.  You may consume probiotics.  Probiotics are active cultures of beneficial bacteria. They may lessen the amount and number of diarrheal stools in adults. Probiotics can be found in yogurt with active cultures and in supplements.  Wash your hands well to avoid spreading the virus.  Only take over-the-counter or prescription medicines for pain, discomfort, or fever as directed by your caregiver. Do not give aspirin to children. Antidiarrheal medicines are not recommended.  Ask your caregiver if you should continue to take your regular prescribed and over-the-counter medicines.  Keep all follow-up appointments as directed by your caregiver. SEEK IMMEDIATE MEDICAL CARE IF:   You are unable to keep fluids down.  You do not urinate at least once every 6 to 8 hours.  You develop shortness of breath.  You notice blood in your stool or vomit. This may look like coffee grounds.  You have abdominal pain that increases or is concentrated in one small area (localized).  You have persistent vomiting or diarrhea.  You have a fever.  The patient is a child younger than 3 months, and he or she has a fever.  The patient is a child older than 3 months, and he or she has a fever and persistent symptoms.  The patient is a child older than 3 months, and he or she has a fever and symptoms suddenly get worse.  The patient is a baby, and he or she has no tears when crying. MAKE SURE YOU:   Understand these instructions.  Will watch your condition.  Will get help right away if you are not doing well or get worse. Document Released: 04/07/2005 Document Revised: 06/30/2011 Document Reviewed: 01/22/2011   [employer] Patient Information 2014 [employer].  

## 2013-06-05 NOTE — Progress Notes (Signed)
Carmelia Roller CNM on unit and aware of pt's admission and status.

## 2013-06-05 NOTE — MAU Note (Signed)
Contractions woke me about 0130. Started having diarrhea about 0200 and vomiting about 0400. Diarrhea x 3 and vomiting x 2.

## 2013-06-05 NOTE — Progress Notes (Signed)
Hansel Feinstein CNM

## 2013-06-05 NOTE — MAU Provider Note (Signed)
History     CSN: [id number]  Arrival date and time: 06/05/13 0550   None     Chief Complaint  Patient presents with  . Diarrhea  . Emesis During Pregnancy  . Contractions   HPI This is a 28 y.o. female at [redacted]w[redacted]d who presents with c/o vomiting and diarrhea as described below. Still has some nausea now. No fever. Her child had a short bout of Gastroenteritis this week. Not having many UCs. Declines cervix check  RN Note: Contractions woke me about 0130. Started having diarrhea about 0200 and vomiting about 0400. Diarrhea x 3 and vomiting x 2.        OB History   Grav Para Term Preterm Abortions TAB SAB Ect Mult Living   2 1 1    0 0   1      Past Medical History  Diagnosis Date  . No pertinent past medical history   . Asthma     Past Surgical History  Procedure Laterality Date  . No past surgeries    . Cesarean section  02/15/2011    Procedure: CESAREAN SECTION;  Surgeon: Melina Schools, MD;  Location: [facility];  Service: Gynecology;  Laterality: N/A;  primary of baby boy  at 2054  APGAR 8/9    Family History  Problem Relation Age of Onset  . Diabetes Maternal Grandmother   . Cancer Maternal Grandmother   . Diabetes Maternal Grandfather     History  Substance Use Topics  . Smoking status: Never Smoker   . Smokeless tobacco: Not on file  . Alcohol Use: No    Allergies: No Known Allergies  Prescriptions prior to admission  Medication Sig Dispense Refill  . acetaminophen (TYLENOL) 500 MG tablet Take 1,000 mg by mouth every 6 (six) hours as needed for pain (For headache.).       Marland Kitchen beclomethasone (QVAR) 40 MCG/ACT inhaler Inhale 2 puffs into the lungs 2 (two) times daily.  1 Inhaler  2  . calcium carbonate (TUMS - DOSED IN MG ELEMENTAL CALCIUM) 500 MG chewable tablet Chew 2 tablets by mouth daily.      . cetirizine (ZYRTEC) 10 MG tablet Take 10 mg by mouth daily.        . Prenatal Vit-Fe Fumarate-FA (PRENATAL MULTIVITAMIN) TABS Take 1 tablet by mouth daily at  12 noon.      Marland Kitchen oseltamivir (TAMIFLU) 75 MG capsule Take 1 capsule (75 mg total) by mouth every 12 (twelve) hours.  10 capsule  0    Review of Systems  Constitutional: Positive for malaise/fatigue. Negative for fever and chills.  Gastrointestinal: Positive for nausea, vomiting and diarrhea. Negative for abdominal pain and constipation.  Genitourinary: Negative for dysuria.  Neurological: Negative for dizziness, weakness and headaches.   Physical Exam   Blood pressure 124/80, pulse 115, temperature 97.3 F (36.3 C), resp. rate 20, height 5\' 2"  (1.575 m), weight 80.377 kg (177 lb 3.2 oz).  Physical Exam  Constitutional: She is oriented to person, place, and time. She appears well-developed and well-nourished. No distress.  HENT:  Head: Normocephalic.  Cardiovascular: Normal rate.   Respiratory: Effort normal.  GI: Soft. She exhibits no distension. There is no tenderness. There is no rebound and no guarding.  Genitourinary:  Declined FHR reassuring  Irregular contractions  Musculoskeletal: Normal range of motion.  Neurological: She is alert and oriented to person, place, and time.  Skin: Skin is warm and dry.  Psychiatric: She has a normal mood and affect.  MAU Course  Procedures  MDM Offered IV, declined. Wants to try Zofran first.  Had good relief from Zofran. Wants to go home with Rx for same  Assessment and Plan  A:  [facility] at [redacted]w[redacted]d       Probable viral Gastroenteritis  P:  Discharge home       Rx Zofran for PRN use       Advance diet as tolerated       Followup in office  [facility] 06/05/2013, 6:26 AM

## 2013-06-08 ENCOUNTER — Encounter (HOSPITAL_COMMUNITY): Payer: Self-pay | Admitting: Pharmacist

## 2013-06-21 ENCOUNTER — Encounter (HOSPITAL_COMMUNITY): Payer: Self-pay

## 2013-06-21 ENCOUNTER — Encounter (HOSPITAL_COMMUNITY)
Admission: RE | Admit: 2013-06-21 | Discharge: 2013-06-21 | Disposition: A | Discharge status: Home / Self Care | Payer: 59 | Source: Ambulatory Visit | Attending: Obstetrics and Gynecology | Admitting: Obstetrics and Gynecology

## 2013-06-21 HISTORY — DX: Other seasonal allergic rhinitis: J30.2

## 2013-06-21 HISTORY — DX: Other specified pregnancy related conditions, unspecified trimester: O26.899

## 2013-06-21 HISTORY — DX: Heartburn: R12

## 2013-06-21 LAB — TYPE AND SCREEN
ABO/RH(D): A POS
Antibody Screen: NEGATIVE

## 2013-06-21 LAB — CBC
HCT: 36.5 % (ref 36.0–46.0)
Hemoglobin: 12.6 g/dL (ref 12.0–15.0)
MCH: 29.6 pg (ref 26.0–34.0)
MCHC: 34.5 g/dL (ref 30.0–36.0)
MCV: 85.9 fL (ref 78.0–100.0)
Platelets: 225 10*3/uL (ref 150–400)
RBC: 4.25 MIL/uL (ref 3.87–5.11)
RDW: 13.4 % (ref 11.5–15.5)
WBC: 7.6 10*3/uL (ref 4.0–10.5)

## 2013-06-21 LAB — RPR: RPR Ser Ql: NONREACTIVE

## 2013-06-21 NOTE — Patient Instructions (Addendum)
   Your procedure is scheduled on:  Thursday, Mar 5  Enter through the [employer] [facility] at:  8 AM Pick up the phone at the desk and dial [phone] and inform us of your arrival.  Please call this number if you have any problems the morning of surgery: [phone]  Remember: Do not eat food after midnight: Wednesday Take these medicines the morning of surgery with a SIP OF WATER:  May use Qvar inhaler if needed.  Bring inhaler with you to the hospital.  Do not wear jewelry, make-up, or FINGER nail polish No metal in your hair or on your body. Do not wear lotions, powders, perfumes.  You may wear deodorant.  Do not bring valuables to the hospital. Contacts, dentures or bridgework may not be worn into surgery.  Leave suitcase in the car. After Surgery it may be brought to your room. For patients being admitted to the hospital, checkout time is 11:00am the day of discharge.  Home with husband "Alyssa Guerra"  cell [phone].

## 2013-06-21 NOTE — H&P (Signed)
Alyssa Guerra is a 28 y.o. female G2P1001 at 7+ for rLTCS.  Relatively uncomplicated prenatal care.  Pregnancy complicated by maternal asthma, gbbs negative.  +FM, no LOF, no VB, occ ctx.  D/w pt r/b/a of LTCS, voices understanding - wishes to proceed.   Maternal Medical History:  Contractions: Frequency: irregular.    Fetal activity: Perceived fetal activity is normal.    Prenatal Complications - Diabetes: none.    OB History   Grav Para Term Preterm Abortions TAB SAB Ect Mult Living   2 1 1    0 0   1    G1 6#12 female, LTCS, G2 present No STDs No abn pap  Past Medical History  Diagnosis Date  . Asthma   . Seasonal allergies   . Heartburn in pregnancy    Past Surgical History  Procedure Laterality Date  . Cesarean section  02/15/2011    Procedure: CESAREAN SECTION;  Surgeon: Melina Schools, MD;  Location: [facility];  Service: Gynecology;  Laterality: N/A;  primary of baby boy  at 2054  APGAR 8/9   Family History: family history includes Cancer in her maternal grandmother; Diabetes in her maternal grandfather and maternal grandmother. Social History:  reports that she has never smoked. She has never used smokeless tobacco. She reports that she drinks alcohol. She reports that she does not use illicit drugs.no Etoh in pregnancy; Financial controller at [employer], married  Meds QVAR, Zyrtec, PNV All NKDA   Prenatal Transfer Tool  Maternal Diabetes: No Genetic Screening: Normal Maternal Ultrasounds/Referrals: Normal Fetal Ultrasounds or other Referrals:  None Maternal Substance Abuse:  No Significant Maternal Medications:  None Significant Maternal Lab Results:  Lab values include: Group B Strep negative Other Comments:  None  Review of Systems  Constitutional: Negative.   HENT: Negative.   Eyes: Negative.   Respiratory: Negative.   Cardiovascular: Negative.   Gastrointestinal: Negative.   Genitourinary: Negative.   Musculoskeletal: Negative.   Skin: Negative.    Neurological: Negative.   Psychiatric/Behavioral: Negative.       There were no vitals taken for this visit. Maternal Exam:  Uterine Assessment: Contraction frequency is irregular.   Abdomen: Surgical scars: low transverse.   Fundal height is appropriate for gestation.   Estimated fetal weight is 7#.   Fetal presentation: vertex  Introitus: Normal vulva. Normal vagina.  Cervix: Cervix evaluated by digital exam.     Physical Exam  Constitutional: She is oriented to person, place, and time. She appears well-developed and well-nourished.  HENT:  Head: Normocephalic and atraumatic.  Cardiovascular: Normal rate and regular rhythm.   Respiratory: Effort normal and breath sounds normal. No respiratory distress. She has no wheezes.  GI: Soft. Bowel sounds are normal. She exhibits no distension. There is no tenderness.  Musculoskeletal: Normal range of motion.  Neurological: She is alert and oriented to person, place, and time.  Skin: Skin is warm and dry.  Psychiatric: She has a normal mood and affect. Her behavior is normal.    Prenatal labs: ABO, Rh: --/--/A POS (03/03 1350) Antibody: NEG (03/03 1350) Rubella:  immune RPR: NON REACTIVE (03/03 1350)  HBsAg:   negative HIV:   neg GBS:   neg  Pap WNL, Varicella immune/ AFP and 1st Tri WNL/GC neg/ Chl neg/ glucola 84/ Hgb 12.7/  7wk scan [facility] Nl anat, post plac, female Tdap 04/07/13 Flu 11/14  Assessment/Plan: 27yo G2P1001 at 40+ for rLTCS gbbs neg D/w pt r/b/a of LTCS- will proceed   BOVARD,Ritvik Mczeal  06/21/2013, 9:14 PM

## 2013-06-23 ENCOUNTER — Encounter (HOSPITAL_COMMUNITY): Payer: Self-pay | Admitting: *Deleted

## 2013-06-23 ENCOUNTER — Encounter (HOSPITAL_COMMUNITY): Payer: 59 | Admitting: Anesthesiology

## 2013-06-23 ENCOUNTER — Encounter (HOSPITAL_COMMUNITY)
Admission: RE | Disposition: A | Discharge status: Home / Self Care | Payer: Self-pay | Source: Ambulatory Visit | Attending: Obstetrics and Gynecology

## 2013-06-23 ENCOUNTER — Inpatient Hospital Stay (HOSPITAL_COMMUNITY): Payer: 59 | Admitting: Anesthesiology

## 2013-06-23 ENCOUNTER — Inpatient Hospital Stay (HOSPITAL_COMMUNITY)
Admission: RE | Admit: 2013-06-23 | Discharge: 2013-06-25 | DRG: 766 | Disposition: A | Discharge status: Home / Self Care | Payer: 59 | Source: Ambulatory Visit | Attending: Obstetrics and Gynecology | Admitting: Obstetrics and Gynecology

## 2013-06-23 DIAGNOSIS — O34219 Maternal care for unspecified type scar from previous cesarean delivery: Principal | ICD-10-CM | POA: Diagnosis present

## 2013-06-23 DIAGNOSIS — O321XX0 Maternal care for breech presentation, not applicable or unspecified: Secondary | ICD-10-CM | POA: Diagnosis present

## 2013-06-23 DIAGNOSIS — J45909 Unspecified asthma, uncomplicated: Secondary | ICD-10-CM | POA: Diagnosis present

## 2013-06-23 DIAGNOSIS — K219 Gastro-esophageal reflux disease without esophagitis: Secondary | ICD-10-CM | POA: Diagnosis present

## 2013-06-23 DIAGNOSIS — Z98891 History of uterine scar from previous surgery: Secondary | ICD-10-CM

## 2013-06-23 HISTORY — DX: History of uterine scar from previous surgery: Z98.891

## 2013-06-23 SURGERY — Surgical Case
Anesthesia: Spinal | Site: Abdomen

## 2013-06-23 MED ORDER — LACTATED RINGERS IV SOLN
INTRAVENOUS | Status: DC
  Administered 2013-06-23 – 2013-06-24 (×2): via INTRAVENOUS

## 2013-06-23 MED ORDER — NALBUPHINE HCL 10 MG/ML IJ SOLN
5.0000 mg | INTRAMUSCULAR | Status: DC | PRN
  Administered 2013-06-23: 10 mg via INTRAVENOUS

## 2013-06-23 MED ORDER — MEPERIDINE HCL 25 MG/ML IJ SOLN: 6.2500 mg | INTRAMUSCULAR | Status: DC | PRN

## 2013-06-23 MED ORDER — ONDANSETRON HCL 4 MG/2ML IJ SOLN: 4.0000 mg | Freq: Three times a day (TID) | INTRAMUSCULAR | Status: DC | PRN

## 2013-06-23 MED ORDER — KETOROLAC TROMETHAMINE 30 MG/ML IJ SOLN: 30.0000 mg | Freq: Four times a day (QID) | INTRAMUSCULAR | Status: AC | PRN

## 2013-06-23 MED ORDER — DEXTROSE 5 % IV SOLN
1.0000 ug/kg/h | INTRAVENOUS | Status: DC | PRN
  Filled 2013-06-23: qty 2

## 2013-06-23 MED ORDER — LORATADINE 10 MG PO TABS
10.0000 mg | ORAL_TABLET | Freq: Every day | ORAL | Status: DC
  Administered 2013-06-24 – 2013-06-25 (×2): 10 mg via ORAL
  Filled 2013-06-23 (×3): qty 1

## 2013-06-23 MED ORDER — SENNOSIDES-DOCUSATE SODIUM 8.6-50 MG PO TABS
2.0000 | ORAL_TABLET | ORAL | Status: DC
Start: 2013-06-24 — End: 2013-06-25
  Administered 2013-06-24 – 2013-06-25 (×2): 2 via ORAL
  Filled 2013-06-23 (×2): qty 2

## 2013-06-23 MED ORDER — SCOPOLAMINE 1 MG/3DAYS TD PT72
MEDICATED_PATCH | TRANSDERMAL | Status: AC
  Administered 2013-06-23: 1.5 mg via TRANSDERMAL
  Filled 2013-06-23: qty 1

## 2013-06-23 MED ORDER — IBUPROFEN 800 MG PO TABS
800.0000 mg | ORAL_TABLET | Freq: Three times a day (TID) | ORAL | Status: DC
  Administered 2013-06-24 – 2013-06-25 (×5): 800 mg via ORAL
  Filled 2013-06-23 (×5): qty 1

## 2013-06-23 MED ORDER — MORPHINE SULFATE (PF) 0.5 MG/ML IJ SOLN
INTRAMUSCULAR | Status: DC | PRN
  Administered 2013-06-23: .15 mg via INTRATHECAL

## 2013-06-23 MED ORDER — OXYTOCIN 10 UNIT/ML IJ SOLN
INTRAMUSCULAR | Status: AC
Start: 2013-06-23 — End: 2013-06-23
  Filled 2013-06-23: qty 4

## 2013-06-23 MED ORDER — NALOXONE HCL 0.4 MG/ML IJ SOLN: 0.4000 mg | INTRAMUSCULAR | Status: DC | PRN

## 2013-06-23 MED ORDER — METOCLOPRAMIDE HCL 5 MG/ML IJ SOLN: 10.0000 mg | Freq: Once | INTRAMUSCULAR | Status: DC | PRN

## 2013-06-23 MED ORDER — LACTATED RINGERS IV SOLN
INTRAVENOUS | Status: DC
  Administered 2013-06-23 (×2): via INTRAVENOUS

## 2013-06-23 MED ORDER — ONDANSETRON HCL 4 MG/2ML IJ SOLN: 4.0000 mg | INTRAMUSCULAR | Status: DC | PRN

## 2013-06-23 MED ORDER — PHENYLEPHRINE 8 MG IN D5W 100 ML (0.08MG/ML) PREMIX OPTIME
INJECTION | INTRAVENOUS | Status: DC | PRN
  Administered 2013-06-23: 60 ug/min via INTRAVENOUS

## 2013-06-23 MED ORDER — KETOROLAC TROMETHAMINE 60 MG/2ML IM SOLN
INTRAMUSCULAR | Status: AC
  Administered 2013-06-23: 60 mg via INTRAMUSCULAR
  Filled 2013-06-23: qty 2

## 2013-06-23 MED ORDER — LACTATED RINGERS IV SOLN
Freq: Once | INTRAVENOUS | Status: AC
  Administered 2013-06-23: 08:00:00 via INTRAVENOUS

## 2013-06-23 MED ORDER — SIMETHICONE 80 MG PO CHEW
80.0000 mg | CHEWABLE_TABLET | Freq: Three times a day (TID) | ORAL | Status: DC
  Administered 2013-06-23 – 2013-06-25 (×5): 80 mg via ORAL
  Filled 2013-06-23 (×5): qty 1

## 2013-06-23 MED ORDER — ONDANSETRON HCL 4 MG PO TABS: 4.0000 mg | ORAL_TABLET | ORAL | Status: DC | PRN

## 2013-06-23 MED ORDER — CALCIUM CARBONATE ANTACID 500 MG PO CHEW: 2.0000 | CHEWABLE_TABLET | Freq: Four times a day (QID) | ORAL | Status: DC | PRN

## 2013-06-23 MED ORDER — PHENYLEPHRINE 40 MCG/ML (10ML) SYRINGE FOR IV PUSH (FOR BLOOD PRESSURE SUPPORT)
PREFILLED_SYRINGE | INTRAVENOUS | Status: AC
  Filled 2013-06-23: qty 5

## 2013-06-23 MED ORDER — FENTANYL CITRATE 0.05 MG/ML IJ SOLN
INTRAMUSCULAR | Status: AC
  Filled 2013-06-23: qty 2

## 2013-06-23 MED ORDER — FENTANYL CITRATE 0.05 MG/ML IJ SOLN
INTRAMUSCULAR | Status: DC | PRN
  Administered 2013-06-23: 25 ug via INTRATHECAL

## 2013-06-23 MED ORDER — OXYCODONE-ACETAMINOPHEN 5-325 MG PO TABS
1.0000 | ORAL_TABLET | ORAL | Status: DC | PRN
  Administered 2013-06-24 – 2013-06-25 (×5): 1 via ORAL
  Filled 2013-06-23 (×6): qty 1

## 2013-06-23 MED ORDER — PHENYLEPHRINE HCL 10 MG/ML IJ SOLN
INTRAMUSCULAR | Status: DC | PRN
  Administered 2013-06-23: 80 ug via INTRAVENOUS
  Administered 2013-06-23: 40 ug via INTRAVENOUS
  Administered 2013-06-23 (×2): 80 ug via INTRAVENOUS

## 2013-06-23 MED ORDER — MENTHOL 3 MG MT LOZG: 1.0000 | LOZENGE | OROMUCOSAL | Status: DC | PRN

## 2013-06-23 MED ORDER — NALBUPHINE HCL 10 MG/ML IJ SOLN
5.0000 mg | INTRAMUSCULAR | Status: DC | PRN
  Filled 2013-06-23: qty 1

## 2013-06-23 MED ORDER — DIPHENHYDRAMINE HCL 50 MG/ML IJ SOLN: 12.5000 mg | INTRAMUSCULAR | Status: DC | PRN

## 2013-06-23 MED ORDER — METOCLOPRAMIDE HCL 5 MG/ML IJ SOLN: 10.0000 mg | Freq: Three times a day (TID) | INTRAMUSCULAR | Status: DC | PRN

## 2013-06-23 MED ORDER — SIMETHICONE 80 MG PO CHEW: 80.0000 mg | CHEWABLE_TABLET | ORAL | Status: DC | PRN

## 2013-06-23 MED ORDER — DIPHENHYDRAMINE HCL 50 MG/ML IJ SOLN: 25.0000 mg | INTRAMUSCULAR | Status: DC | PRN

## 2013-06-23 MED ORDER — WITCH HAZEL-GLYCERIN EX PADS: 1.0000 "application " | MEDICATED_PAD | CUTANEOUS | Status: DC | PRN

## 2013-06-23 MED ORDER — DIBUCAINE 1 % RE OINT: 1.0000 "application " | TOPICAL_OINTMENT | RECTAL | Status: DC | PRN

## 2013-06-23 MED ORDER — ZOLPIDEM TARTRATE 5 MG PO TABS: 5.0000 mg | ORAL_TABLET | Freq: Every evening | ORAL | Status: DC | PRN

## 2013-06-23 MED ORDER — EPHEDRINE SULFATE 50 MG/ML IJ SOLN
INTRAMUSCULAR | Status: DC | PRN
  Administered 2013-06-23 (×2): 10 mg via INTRAVENOUS

## 2013-06-23 MED ORDER — SCOPOLAMINE 1 MG/3DAYS TD PT72
1.0000 | MEDICATED_PATCH | Freq: Once | TRANSDERMAL | Status: DC
  Filled 2013-06-23: qty 1

## 2013-06-23 MED ORDER — OXYTOCIN 40 UNITS IN LACTATED RINGERS INFUSION - SIMPLE MED: 62.5000 mL/h | INTRAVENOUS | Status: AC

## 2013-06-23 MED ORDER — PHENYLEPHRINE 8 MG IN D5W 100 ML (0.08MG/ML) PREMIX OPTIME
INJECTION | INTRAVENOUS | Status: AC
  Filled 2013-06-23: qty 100

## 2013-06-23 MED ORDER — ONDANSETRON HCL 4 MG/2ML IJ SOLN
INTRAMUSCULAR | Status: AC
  Filled 2013-06-23: qty 2

## 2013-06-23 MED ORDER — SODIUM CHLORIDE 0.9 % IJ SOLN: 3.0000 mL | INTRAMUSCULAR | Status: DC | PRN

## 2013-06-23 MED ORDER — KETOROLAC TROMETHAMINE 60 MG/2ML IM SOLN
60.0000 mg | Freq: Once | INTRAMUSCULAR | Status: AC | PRN
  Filled 2013-06-23: qty 2

## 2013-06-23 MED ORDER — FENTANYL CITRATE 0.05 MG/ML IJ SOLN: 25.0000 ug | INTRAMUSCULAR | Status: DC | PRN

## 2013-06-23 MED ORDER — SCOPOLAMINE 1 MG/3DAYS TD PT72
1.0000 | MEDICATED_PATCH | Freq: Once | TRANSDERMAL | Status: DC
Start: 2013-06-23 — End: 2013-06-23
  Administered 2013-06-23: 1.5 mg via TRANSDERMAL

## 2013-06-23 MED ORDER — DIPHENHYDRAMINE HCL 25 MG PO CAPS: 25.0000 mg | ORAL_CAPSULE | ORAL | Status: DC | PRN

## 2013-06-23 MED ORDER — SIMETHICONE 80 MG PO CHEW
80.0000 mg | CHEWABLE_TABLET | ORAL | Status: DC
  Administered 2013-06-24 – 2013-06-25 (×2): 80 mg via ORAL
  Filled 2013-06-23 (×2): qty 1

## 2013-06-23 MED ORDER — DIPHENHYDRAMINE HCL 25 MG PO CAPS: 25.0000 mg | ORAL_CAPSULE | Freq: Four times a day (QID) | ORAL | Status: DC | PRN

## 2013-06-23 MED ORDER — MORPHINE SULFATE 0.5 MG/ML IJ SOLN
INTRAMUSCULAR | Status: AC
  Filled 2013-06-23: qty 10

## 2013-06-23 MED ORDER — ONDANSETRON HCL 4 MG/2ML IJ SOLN
INTRAMUSCULAR | Status: DC | PRN
  Administered 2013-06-23: 4 mg via INTRAVENOUS

## 2013-06-23 MED ORDER — KETOROLAC TROMETHAMINE 30 MG/ML IJ SOLN
30.0000 mg | Freq: Four times a day (QID) | INTRAMUSCULAR | Status: AC | PRN
  Administered 2013-06-23: 30 mg via INTRAVENOUS
  Filled 2013-06-23: qty 1

## 2013-06-23 MED ORDER — LACTATED RINGERS IV SOLN: INTRAVENOUS | Status: DC

## 2013-06-23 MED ORDER — PRENATAL MULTIVITAMIN CH
1.0000 | ORAL_TABLET | Freq: Every day | ORAL | Status: DC
  Administered 2013-06-24: 1 via ORAL
  Filled 2013-06-23: qty 1

## 2013-06-23 MED ORDER — LANOLIN HYDROUS EX OINT: 1.0000 "application " | TOPICAL_OINTMENT | CUTANEOUS | Status: DC | PRN

## 2013-06-23 MED ORDER — OXYTOCIN 10 UNIT/ML IJ SOLN
40.0000 [IU] | INTRAVENOUS | Status: DC | PRN
  Administered 2013-06-23: 40 [IU] via INTRAVENOUS

## 2013-06-23 MED ORDER — FLUTICASONE PROPIONATE HFA 44 MCG/ACT IN AERO
2.0000 | INHALATION_SPRAY | Freq: Two times a day (BID) | RESPIRATORY_TRACT | Status: DC
  Administered 2013-06-23 – 2013-06-25 (×5): 2 via RESPIRATORY_TRACT
  Filled 2013-06-23: qty 10.6

## 2013-06-23 MED ORDER — CEFAZOLIN SODIUM-DEXTROSE 2-3 GM-% IV SOLR
INTRAVENOUS | Status: AC
  Filled 2013-06-23: qty 50

## 2013-06-23 MED ORDER — CEFAZOLIN SODIUM-DEXTROSE 2-3 GM-% IV SOLR
2.0000 g | INTRAVENOUS | Status: AC
  Administered 2013-06-23: 2 g via INTRAVENOUS

## 2013-06-23 SURGICAL SUPPLY — 38 items
BENZOIN TINCTURE PRP APPL 2/3 (GAUZE/BANDAGES/DRESSINGS) ×2 IMPLANT
CLAMP CORD UMBIL (MISCELLANEOUS) ×2 IMPLANT
CLOTH BEACON ORANGE TIMEOUT ST (SAFETY) ×2 IMPLANT
CONTAINER PREFILL 10% NBF 15ML (MISCELLANEOUS) IMPLANT
DRAPE LG THREE QUARTER DISP (DRAPES) ×2 IMPLANT
DRSG OPSITE POSTOP 4X10 (GAUZE/BANDAGES/DRESSINGS) ×2 IMPLANT
DRSG TELFA 3X8 NADH (GAUZE/BANDAGES/DRESSINGS) ×2 IMPLANT
DURAPREP 26ML APPLICATOR (WOUND CARE) ×2 IMPLANT
ELECT REM PT RETURN 9FT ADLT (ELECTROSURGICAL) ×2
ELECTRODE REM PT RTRN 9FT ADLT (ELECTROSURGICAL) ×1 IMPLANT
EXTRACTOR VACUUM M CUP 4 TUBE (SUCTIONS) IMPLANT
GLOVE BIO SURGEON STRL SZ 6.5 (GLOVE) ×2 IMPLANT
GOWN STRL REUS W/ TWL XL LVL3 (GOWN DISPOSABLE) ×1 IMPLANT
GOWN STRL REUS W/TWL LRG LVL3 (GOWN DISPOSABLE) ×2 IMPLANT
GOWN STRL REUS W/TWL XL LVL3 (GOWN DISPOSABLE) ×1
KIT ABG SYR 3ML LUER SLIP (SYRINGE) IMPLANT
NEEDLE HYPO 25X5/8 SAFETYGLIDE (NEEDLE) IMPLANT
NS IRRIG 1000ML POUR BTL (IV SOLUTION) ×2 IMPLANT
PACK C SECTION WH (CUSTOM PROCEDURE TRAY) ×2 IMPLANT
PAD OB MATERNITY 4.3X12.25 (PERSONAL CARE ITEMS) ×2 IMPLANT
RTRCTR C-SECT PINK 25CM LRG (MISCELLANEOUS) ×2 IMPLANT
STAPLER VISISTAT 35W (STAPLE) IMPLANT
STRIP CLOSURE SKIN 1/2X4 (GAUZE/BANDAGES/DRESSINGS) ×2 IMPLANT
SUT MNCRL 0 VIOLET CTX 36 (SUTURE) ×2 IMPLANT
SUT MONOCRYL 0 CTX 36 (SUTURE) ×2
SUT PLAIN 1 NONE 54 (SUTURE) IMPLANT
SUT PLAIN 2 0 (SUTURE) ×1
SUT PLAIN 2 0 XLH (SUTURE) IMPLANT
SUT PLAIN ABS 2-0 CT1 27XMFL (SUTURE) ×1 IMPLANT
SUT VIC AB 0 CT1 27 (SUTURE) ×2
SUT VIC AB 0 CT1 27XBRD ANBCTR (SUTURE) ×2 IMPLANT
SUT VIC AB 2-0 CT1 27 (SUTURE) ×1
SUT VIC AB 2-0 CT1 TAPERPNT 27 (SUTURE) ×1 IMPLANT
SUT VIC AB 4-0 KS 27 (SUTURE) ×2 IMPLANT
SYR BULB IRRIGATION 50ML (SYRINGE) ×2 IMPLANT
TOWEL OR 17X24 6PK STRL BLUE (TOWEL DISPOSABLE) ×2 IMPLANT
TRAY FOLEY CATH 14FR (SET/KITS/TRAYS/PACK) ×2 IMPLANT
WATER STERILE IRR 1000ML POUR (IV SOLUTION) IMPLANT

## 2013-06-23 NOTE — Lactation Note (Signed)
This note was copied from the chart of Alyssa Martinique Greb. Lactation Consultation Note  Patient Name: Alyssa Guerra [id number] Date: 06/23/2013 Reason for consult: Initial assessment Lactation visit to assess latch and feeding. Mother is experienced and successfully breastfeed her other child. She demonstrates independence with latching baby and he fed well in a rhythmic pattern. Slight assistance to get baby deeper on the breast as he is tongue thrusting at times. Mother can hand express colostrum. Infant is LGA and blood sugar was checked (60) per mother. Lactation brochure left with an explanation of inpatient and post discharge services and support groups. LC to follow as needed.   Maternal Data    Feeding Feeding Type: Breast Fed Length of feed: 50 min  LATCH Score/Interventions Latch: Grasps breast easily, tongue down, lips flanged, rhythmical sucking.  Audible Swallowing: A few with stimulation Intervention(s): Skin to skin  Type of Nipple: Everted at rest and after stimulation  Comfort (Breast/Nipple): Soft / non-tender     Hold (Positioning): No assistance needed to correctly position infant at breast. (pillows added for support)  LATCH Score: 9  Lactation Tools Discussed/Used     Consult Status Consult Status: PRN    Gwenevere Abbot 06/23/2013, 4:14 PM

## 2013-06-23 NOTE — Anesthesia Postprocedure Evaluation (Signed)
  Anesthesia Post-op Note  Patient: Alyssa Guerra  Procedure(s) Performed: Procedure(s) with comments: REPEAT CESAREAN SECTION (N/A) - 1 1/2hrs OR time  Patient Location: Mother/Baby  Anesthesia Type:Spinal  Level of Consciousness: awake, alert , oriented and patient cooperative  Airway and Oxygen Therapy: Patient Spontanous Breathing  Post-op Pain: mild  Post-op Assessment: Patient's Cardiovascular Status Stable, Respiratory Function Stable and No residual motor weakness or numbness  Post-op Vital Signs: stable  Complications: No apparent anesthesia complications

## 2013-06-23 NOTE — Anesthesia Postprocedure Evaluation (Signed)
  Anesthesia Post-op Note  Patient: Alyssa Guerra  Procedure(s) Performed: Procedure(s) with comments: REPEAT CESAREAN SECTION (N/A) - 1 1/2hrs OR time   Patient is awake, responsive, moving her legs, and has signs of resolution of her numbness. Pain and nausea are reasonably well controlled. Vital signs are stable and clinically acceptable. Oxygen saturation is clinically acceptable. There are no apparent anesthetic complications at this time. Patient is ready for discharge.

## 2013-06-23 NOTE — Interval H&P Note (Signed)
History and Physical Interval Note:  06/23/2013 8:37 AM  Alyssa Guerra  has presented today for surgery, with the diagnosis of Repeat C/Section, [phone]  The various methods of treatment have been discussed with the patient and family. After consideration of risks, benefits and other options for treatment, the patient has consented to  Procedure(s) with comments: CESAREAN SECTION (N/A) - 1 1/2hrs OR time as a surgical intervention .  The patient's history has been reviewed, patient examined, no change in status, stable for surgery.  I have reviewed the patient's chart and labs.  Questions were answered to the patient's satisfaction.     BOVARD,Valen Gillison

## 2013-06-23 NOTE — Transfer of Care (Signed)
Immediate Anesthesia Transfer of Care Note  Patient: Alyssa Guerra  Procedure(s) Performed: Procedure(s) with comments: REPEAT CESAREAN SECTION (N/A) - 1 1/2hrs OR time  Patient Location: PACU  Anesthesia Type:Spinal  Level of Consciousness: awake, alert  and oriented  Airway & Oxygen Therapy: Patient Spontanous Breathing  Post-op Assessment: Report given to PACU RN and Post -op Vital signs reviewed and stable  Post vital signs: Reviewed and stable  Complications: No apparent anesthesia complications

## 2013-06-23 NOTE — Anesthesia Preprocedure Evaluation (Addendum)
Anesthesia Evaluation  Patient identified by MRN, date of birth, ID band Patient awake    Reviewed: Allergy & Precautions, H&P , NPO status , Patient's Chart, lab work & pertinent test results, reviewed documented beta blocker date and time   History of Anesthesia Complications Negative for: history of anesthetic complications  Airway Mallampati: II TM Distance: >3 FB Neck ROM: full    Dental  (+) Teeth Intact   Pulmonary asthma (well controlled - does not use rescue inhaler) ,  breath sounds clear to auscultation        Cardiovascular negative cardio ROS  Rhythm:regular Rate:Normal     Neuro/Psych negative neurological ROS  negative psych ROS   GI/Hepatic Neg liver ROS, GERD- (tums prn)  ,  Endo/Other  negative endocrine ROS  Renal/GU negative Renal ROS     Musculoskeletal   Abdominal   Peds  Hematology negative hematology ROS (+)   Anesthesia Other Findings   Reproductive/Obstetrics (+) Pregnancy (h/o c/s x1 - repeat c/s)                           Anesthesia Physical Anesthesia Plan  ASA: II  Anesthesia Plan: Spinal   Post-op Pain Management:    Induction:   Airway Management Planned:   Additional Equipment:   Intra-op Plan:   Post-operative Plan:   Informed Consent: I have reviewed the patients History and Physical, chart, labs and discussed the procedure including the risks, benefits and alternatives for the proposed anesthesia with the patient or authorized representative who has indicated his/her understanding and acceptance.     Plan Discussed with: Surgeon and CRNA  Anesthesia Plan Comments:         Anesthesia Quick Evaluation

## 2013-06-23 NOTE — Progress Notes (Signed)
06/23/13 1100  Clinical Encounter Type  Visited With Patient and family together (husband, baby Gareth Eagle)  Visit Type Spiritual support;Social support  Referral From Patient  Spiritual Encounters  Spiritual Needs Emotional   Made pastoral visit to welcome baby Gareth Eagle, offering celebration and encouragement to my [facility] colleague and her family.  Family joyful, relieved, appreciative.  [place], [place]

## 2013-06-23 NOTE — Brief Op Note (Signed)
06/23/2013  10:19 AM  PATIENT:  Alyssa Guerra  28 y.o. female  PRE-OPERATIVE DIAGNOSIS:  Repeat Cesearean Section, [phone]  POST-OPERATIVE DIAGNOSIS:  Repeat Cesearean Section, delivered  PROCEDURE:  Procedure(s) with comments: REPEAT CESAREAN SECTION (N/A) - 1 1/2hrs OR time  SURGEON:  Surgeon(s) and Role:    * Thornell Sartorius, MD - Primary    * Melina Schools, MD - Assisting  ANESTHESIA:   spinal  EBL:  Total I/O In: 2500 [I.V.:2500] Out: 950 [Urine:200; Blood:750]  FINDINGS: viable female infant at 9:34am apgars 9/9, wt Pending; nl uterus, tubes and ovaries  BLOOD ADMINISTERED:none  DRAINS: Urinary Catheter (Foley)   LOCAL MEDICATIONS USED:  NONE  SPECIMEN:  Source of Specimen:  Placenta  DISPOSITION OF SPECIMEN:  L&D  COUNTS:  YES  TOURNIQUET:  * No tourniquets in log *  DICTATION: .Other Dictation: Dictation Number [id number]  PLAN OF CARE: Admit to inpatient   PATIENT DISPOSITION:  PACU - hemodynamically stable.   Delay start of Pharmacological VTE agent (>24hrs) due to surgical blood loss or risk of bleeding: not applicable

## 2013-06-23 NOTE — Anesthesia Procedure Notes (Signed)

## 2013-06-23 NOTE — Addendum Note (Signed)
Addendum created 06/23/13 1514 by Georgeanne Nim, CRNA   Modules edited: Notes Section   Notes Section:  File: [id number]

## 2013-06-24 ENCOUNTER — Encounter (HOSPITAL_COMMUNITY): Payer: Self-pay | Admitting: Obstetrics and Gynecology

## 2013-06-24 LAB — CBC
HCT: 32.6 % — ABNORMAL LOW (ref 36.0–46.0)
Hemoglobin: 11.1 g/dL — ABNORMAL LOW (ref 12.0–15.0)
MCH: 29.3 pg (ref 26.0–34.0)
MCHC: 34 g/dL (ref 30.0–36.0)
MCV: 86 fL (ref 78.0–100.0)
Platelets: 176 10*3/uL (ref 150–400)
RBC: 3.79 MIL/uL — ABNORMAL LOW (ref 3.87–5.11)
RDW: 13.4 % (ref 11.5–15.5)
WBC: 9.3 10*3/uL (ref 4.0–10.5)

## 2013-06-24 LAB — BIRTH TISSUE RECOVERY COLLECTION (PLACENTA DONATION)

## 2013-06-24 NOTE — Op Note (Signed)
NAME:  Alyssa Guerra, Alyssa Guerra             ACCOUNT NO.:  [id number]  MEDICAL RECORD NO.:  [id number]  LOCATION:  6295                          FACILITY:  [facility]  PHYSICIAN:  Thornell Sartorius, MD        DATE OF BIRTH:  August 05, 1985  DATE OF PROCEDURE:  06/23/2013 DATE OF DISCHARGE:                              OPERATIVE REPORT   PREOPERATIVE DIAGNOSIS:  Intrauterine pregnancy at term, repeat cesarean section.  POSTOPERATIVE DIAGNOSIS:  Intrauterine pregnancy at term, repeat cesarean Section, delivered.  PROCEDURE:  Repeat low transverse cesarean section.  SURGEON:  Thornell Sartorius, MD.  ASSISTANTLucille Passy. Ulanda Edison, M.D  ANESTHESIA:  Spinal.  EBL:  750 mL.  URINE OUTPUT:  200 mL.Clear urine at the end of procedure.  IV FLUIDS:  2500 mL.  FINDINGS:  Viable female infant at 3:48 am with Apgars of 9 at 1 minute, 9 at 5 minutes.  Weight pending at the time of dictation.  Normal uterus, tubes, and ovaries were also noted.  COMPLICATIONS:  None.  PATHOLOGY:  Placenta to Labor and Delivery.  PROCEDURE:  After informed consent was reviewed, procedure the risks, benefits, and alternatives of surgical procedure, she was transported to the OR, where spinal anesthesia was placed and found to be adequate. She was then placed in the supine position with a leftward tilt, prepped and draped in the normal usual sterile fashion.  Her bladder was sterilely drained with a Foley catheter and a Pfannenstiel skin incision was made at the level of her previous incision, carried through the underlying layer of fascia sharply.  Fascia was incised in the midline, and the midline incision was extended laterally with Mayo scissors.  The superior aspect of the fascia incision was grasped with kocher clamps elevated, and the rectus muscles were dissected off both bluntly and sharply. Midline was easily identified.  The peritoneum was entered bluntly. Incision was extended superiorly and inferiorly with good  visualization of the bladder.  The Alexis skin retractor was placed carefully making sure no bowel was entrapped.  Uterus inspected incised in transverse fashion.  The infant was delivered from a frank breech presentation with no complication.  Nose and mouth were suctioned on the field.  Cord was clamped and cut.  Infant was handed off to the awaiting pediatric staff.  The placenta was expressed.  Uterus was cleared of all clot and debris.  Uterine incision was closed with 2 layers of 0 Monocryl, the first of which is running locked and second as an imbricating layer.  The gutters were cleared.  The tubes and ovaries were inspected.  Incision was noted to be hemostatic.  The Alexis retractor was removed.  The peritoneum was closed with 2-0 Vicryl in a running fashion.  Subfascial planes were inspected, found to be hemostatic.  The fascia was closed with 0 Vicryl from either corner overlapping in the midline.  The subcuticular adipose layer was made hemostatic with Bovie cautery.  The dead space was closed with 2-0 plain gut.  The skin was closed with 4-0 Vicryl in a subcu fashion.  Benzoin and Steri-Strips applied.  The patient tolerated procedure well. Sponge, lap, and needle count was correct x2 per the  operating staff.     Thornell Sartorius, MD     JB/MEDQ  D:  06/23/2013  T:  06/24/2013  Job:  [id number]

## 2013-06-24 NOTE — Progress Notes (Signed)
Subjective: Postpartum Day 1: Cesarean Delivery Patient reports incisional pain and tolerating PO.  Nl lochia, pain controlled  Objective: Vital signs in last 24 hours: Temp:  [97.5 F (36.4 C)-98.4 F (36.9 C)] 97.8 F (36.6 C) (03/06 0548) Pulse Rate:  [65-97] 67 (03/06 0548) Resp:  [12-23] 18 (03/06 0548) BP: (99-125)/(52-83) 102/64 mmHg (03/06 0548) SpO2:  [95 %-100 %] 97 % (03/06 0548) Weight:  [81.2 kg (179 lb 0.2 oz)] 81.2 kg (179 lb 0.2 oz) (03/05 1130)  Physical Exam:  General: alert and no distress Lochia: appropriate Uterine Fundus: firm Incision: healing well DVT Evaluation: No evidence of DVT seen on physical exam.   Recent Labs  06/21/13 1350 06/24/13 0550  HGB 12.6 11.1*  HCT 36.5 32.6*    Assessment/Plan: Status post Cesarean section. Doing well postoperatively.  Continue current care.  BOVARD,Tziporah Knoke 06/24/2013, 7:38 AM

## 2013-06-24 NOTE — Lactation Note (Signed)
This note was copied from the chart of Alyssa Guerra. Lactation Consultation Note          Follow up consult with this mom of a term baby, now 33 hours old. The baby had just breast fed, and was in a deep sleep. Mom reports that her pediatrician noted a tight, short, anterior tongue frenulum. On exam, he has a heart shaped tongue on extension, a short frenulum noted very close to the tip of his tongue. The baby also noted to have an upper  Lip frenulum that extends to his gum lin, but this does not impede upper lip movement/flanging. . Mom and dad aware of my assessment. I reviewed hand expression with mom, and she has lots of easily expressed colostrum,transitional milk. Seeing how sated this over 9 lb baby appears, and her good amount of expressed milk, I  Told mom Ido think her baby is transferring, despite his short frenulum. Mom is concerned that he has only voided once since birth, and already had lost some weight. She asked if I would set up a DEP for her, which I did, and I advised her to post pump about every 4 hours. I gave mom a spoon ( she knows how to spoon feed), and a feeding cup, and with some EBM, together we fed her baby from the cup. I had mom sit him up straight, very slowly tip the cup. He was not able to extent his tongue to lap up the milk, but was able to suck the tip of the cup and swallow the milk. Mom has tender, sore nipples. The nipple tips are red. I fitted her for a 24 nipple shield, and mom demonstrated how to apply with good technique. I also gave mom comfort gels, and instructed her in their use. Mom knows to call for questions/concerns.  Patient Name: Alyssa Alyssa Guerra [id number] Date: 06/24/2013 Reason for consult: Follow-up assessment;Breast/nipple pain   Maternal Data    Feeding Feeding Type: Breast Fed Length of feed: 20 min  LATCH Score/Interventions Latch: Repeated attempts needed to sustain latch, nipple held in mouth throughout feeding, stimulation needed  to elicit sucking reflex. Intervention(s): Adjust position;Assist with latch;Breast massage  Audible Swallowing: A few with stimulation  Type of Nipple: Everted at rest and after stimulation  Comfort (Breast/Nipple): Filling, red/small blisters or bruises, mild/mod discomfort  Problem noted: Mild/Moderate discomfort Interventions  (Cracked/bleeding/bruising/blister): Expressed breast milk to nipple Interventions (Mild/moderate discomfort): Comfort gels  Hold (Positioning): No assistance needed to correctly position infant at breast.  LATCH Score: 8  Lactation Tools Discussed/Used Tools: Nipple Shields;Pump;Feeding cup;Comfort gels Nipple shield size: 24 Breast pump type: Double-Electric Breast Pump Pump Review: Setup, frequency, and cleaning;Other (comment) (premie setting advised for next 24 hours, unless mom pumping up to 20 mls today, then standard setting) Initiated by:: Mechele Claude, RN, [place] Date initiated:: 06/24/13   Consult Status Consult Status: Follow-up Date: 06/25/13 Follow-up type: In-patient    Tonna Corner 06/24/2013, 3:31 PM

## 2013-06-25 MED ORDER — OXYCODONE-ACETAMINOPHEN 5-325 MG PO TABS
1.0000 | ORAL_TABLET | Freq: Four times a day (QID) | ORAL | Status: DC | PRN
Start: 2013-06-25 — End: 2013-07-31

## 2013-06-25 MED ORDER — IBUPROFEN 800 MG PO TABS: 800.0000 mg | ORAL_TABLET | Freq: Three times a day (TID) | ORAL | Status: DC | PRN

## 2013-06-25 NOTE — Discharge Instructions (Signed)
booklet °

## 2013-06-25 NOTE — Discharge Summary (Signed)
NAME:  Brook, [place]             ACCOUNT NO.:  [id number]  MEDICAL RECORD NO.:  [id number]  LOCATION:  5009                          FACILITY:  [facility]  PHYSICIAN:  Lucille Passy. Ulanda Edison, M.D. DATE OF BIRTH:  1985-11-09  DATE OF ADMISSION:  06/23/2013 DATE OF DISCHARGE:  06/25/2013                              DISCHARGE SUMMARY   HISTORY OF PRESENT ILLNESS:  This is a 28 year old white female, para 1- 0-1-1, gravida 2 at 40+ weeks gestation for repeat low-transverse cervical C-section.  Prenatal care was complicated by maternal asthma. Otherwise, essentially uncomplicated.  Blood group and type A positive, negative antibody, rubella immune, RPR nonreactive, hepatitis B surface antigen negative, HIV negative, group B strep negative.  One hour Glucola 84.  The patient was admitted to the hospital on the morning of surgery and underwent a repeat low-transverse cervical C-section by Dr. Melba Coon, Dr. Ulanda Edison assisting under spinal anesthesia.  The infant was female, 9 pounds 15 ounces, presenting as breech, delivered from a frank breech presentation.  Postoperatively, the patient did extremely well, and on the second postoperative day, she was tolerating a diet, passing flatus, ambulating well, voiding well, and was ready for discharge.  LABORATORY DATA:  A postoperative hemoglobin of 11.1, hematocrit 32.6, white count 9300, platelet count 176,000.  FINAL DIAGNOSIS:  Intrauterine pregnancy at 40+ weeks, delivered as a frank breech.  OPERATION:  Low-transverse cervical C-section.  FINAL CONDITION:  Improved.  INSTRUCTIONS:  Our regular discharge instruction booklet as well as the after visit summary.  Prescriptions for Motrin 800 mg 30 tablets, 1 every 8 hours as needed for pain and Percocet 5/325, 30 tablets, 1 every 6 hours as needed for pain.  She was advised to return to the office in 2 weeks for followup examination.     Lucille Passy. Ulanda Edison, M.D.     TFH/MEDQ  D:  06/25/2013  T:   06/25/2013  Job:  [id number]

## 2013-06-25 NOTE — Progress Notes (Signed)
Patient ID: Alyssa Guerra, female   DOB: [birth year], 28 y.o.   MRN: [id number] #2 afebrile BP normal She has no complaints and is tolerating a diet, passing flatus, ambulating well , voiding well and is ready for d/c.

## 2013-07-14 ENCOUNTER — Ambulatory Visit (HOSPITAL_COMMUNITY)
Admission: RE | Admit: 2013-07-14 | Discharge: 2013-07-14 | Disposition: A | Discharge status: Home / Self Care | Payer: 59 | Source: Ambulatory Visit | Attending: Obstetrics and Gynecology | Admitting: Obstetrics and Gynecology

## 2013-07-14 NOTE — Lactation Note (Signed)
Adult Lactation Consultation Outpatient Visit Note  Patient Name: Alyssa Guerra                              Carson Biller, DOB 06/23/13 at 40.5 wks. Date of Birth: 10/31/1985                                                Birth Weight 9 lb. 15.1 oz, now 3 wks. old Gestational Age at Delivery: Unknown Type of Delivery:  C/S   Breastfeeding History: Frequency of Breastfeeding: every 2 hours during day, at night 2 1/2-3 hours Length of Feeding: 15 minutes 1 breast. Mom will offer 2nd breast Voids: 7-8/day Stools: 3-4/day, mustard/seedy  Supplementing / Method: Pumping:  Type of Pump:   Medela Pump N Style   Frequency:  3 times/day- post pump  Volume:  2-3 oz.   Comments: Here for feeding assessment after frenotomy on Tuesday, March 24th. Mom has been using a nipple shield intermittently since frenotomy. She used the nipple shield last night because the baby was cluster feeding. Mom does report baby is latching better since frenotomy and has latched without the nipple shield. Prior to frenotomy baby could not maintain a good seal on the breast or bottle. Has been slow to gain weight.    Consultation Evaluation: On exam, frenotomy site healed, some scar tissue noted. Mom to continue stretching exercises she was given.   Initial Feeding Assessment: Pre-feed Weight:    9 lb. 15.8oz/4532 gm Post-feed Weight:   10 lb. 2.4 oz.4606 gm Amount Transferred:  74 ml  Comments: from nursing on right breast in cross cradle for 15 minutes without the nipple shield. Mom has very good technique with latching baby, good support of baby and breast. Baby demonstrates a good suckling pattern with audible swallows. Baby is now sustaining the latch with good seal.  Mom's nipple is round when baby comes off the breast. No discomfort reported.   Additional Feeding Assessment: Pre-feed Weight:  10 lb. 2.4 oz/4606gm Post-feed Weight:  10 lb. 3.3 oz/4630 gm Amount Transferred:   24 ml. Comments:  From left  breast with nursing for 5 minutes. Baby sleepy on this breast and appeared satiated. Mom attempted to get baby to nurse longer, however he was not interested. He had a good feeding.   Additional Feeding Assessment: Pre-feed Weight: Post-feed Weight: Amount Transferred: Comments:  Total Breast milk Transferred this Visit: 98 ml Total Supplement Given: None  Additional Interventions: Baby latched well and transferred milk well at this visit. Mom reports frenotomy has made a big difference.  Mom was encouraged that baby transferred milk so well and with the improvement in latch. She plans to keep baby at the breast. LC encouraged Mom to stop using the nipple shield as baby is latching well without it.  Mom plans to continue to post pump 3 times per day to work on milk storage for returning to work. LC advised if baby not satisfied after BF on both breasts for 15-20 minutes, then she could supplement as needed. Continue exercises to stretch frenulum and prevent re-attachment.   Follow-Up OP LC visit, prn Support group, Mondays/Tuesdays     Katrine Coho 07/14/2013, 12:53 PM

## 2013-07-31 ENCOUNTER — Inpatient Hospital Stay (HOSPITAL_COMMUNITY)
Admission: AD | Admit: 2013-07-31 | Discharge: 2013-07-31 | Disposition: A | Discharge status: Home / Self Care | Payer: 59 | Source: Ambulatory Visit | Attending: Obstetrics and Gynecology | Admitting: Obstetrics and Gynecology

## 2013-07-31 ENCOUNTER — Encounter (HOSPITAL_COMMUNITY): Payer: Self-pay | Admitting: *Deleted

## 2013-07-31 DIAGNOSIS — O8600 Infection of obstetric surgical wound, unspecified: Secondary | ICD-10-CM

## 2013-07-31 DIAGNOSIS — O909 Complication of the puerperium, unspecified: Secondary | ICD-10-CM

## 2013-07-31 MED ORDER — CEPHALEXIN 500 MG PO CAPS: 500.0000 mg | ORAL_CAPSULE | Freq: Three times a day (TID) | ORAL | Status: DC

## 2013-07-31 NOTE — MAU Provider Note (Signed)
CC: Post-op Problem    None     HPI Alyssa Guerra is a 28 y.o. G2P2002 6 wks post LTCS who presents with onset over last few days of redness around incision. She also began having some discomfort at incision site especially when walking. Denies any drainage. No fever, chills, nausea vomiting. Procedure without difficulty. Minimal yellowish lochia.    Past Medical History  Diagnosis Date  . Asthma   . Seasonal allergies   . Heartburn in pregnancy   . S/P cesarean section 06/23/2013    OB History  Gravida Para Term Preterm AB SAB TAB Ectopic Multiple Living  2 2 2    0 0   2    # Outcome Date GA Lbr Len/2nd Weight Sex Delivery Anes PTL Lv  2 TRM 06/23/13 [redacted]w[redacted]d  4.51 kg (9 lb 15.1 oz) M LTCS Spinal  Y  1 TRM 02/15/11 [redacted]w[redacted]d  3.065 kg (6 lb 12.1 oz) M LTCS EPI  Y      Past Surgical History  Procedure Laterality Date  . Cesarean section  02/15/2011    Procedure: CESAREAN SECTION;  Surgeon: Melina Schools, MD;  Location: [facility];  Service: Gynecology;  Laterality: N/A;  primary of baby boy  at 2054  APGAR 8/9  . Cesarean section N/A 06/23/2013    Procedure: REPEAT CESAREAN SECTION;  Surgeon: Thornell Sartorius, MD;  Location: [facility];  Service: Obstetrics;  Laterality: N/A;  1 1/2hrs OR time    History   Social History  . Marital Status: Married    Spouse Name: N/A    Number of Children: N/A  . Years of Education: N/A   Occupational History  . Not on file.   Social History Main Topics  . Smoking status: Never Smoker   . Smokeless tobacco: Never Used  . Alcohol Use: Yes     Comment: none with pregnancy  . Drug Use: No  . Sexual Activity: Yes    Birth Control/ Protection: None     Comment: pregnant   Other Topics Concern  . Not on file   Social History Narrative  . No narrative on file    No current facility-administered medications on file prior to encounter.   Current Outpatient Prescriptions on File Prior to Encounter  Medication Sig Dispense Refill  . cetirizine  (ZYRTEC) 10 MG tablet Take 10 mg by mouth daily.        Marland Kitchen ibuprofen (ADVIL,MOTRIN) 800 MG tablet Take 1 tablet (800 mg total) by mouth every 8 (eight) hours as needed.  30 tablet  0  . Prenatal Vit-Fe Fumarate-FA (PRENATAL MULTIVITAMIN) TABS Take 1 tablet by mouth at bedtime.         No Known Allergies  ROS Pertinent items in HPI  PHYSICAL EXAM Filed Vitals:   07/31/13 1538  BP: 130/82  Pulse: 75  Temp: 99 F (37.2 C)  Resp: 18   General: Well nourished, well developed female in no acute distress Cardiovascular: Normal rate Respiratory: Normal effort Abdomen: Soft, very minimally tender over well-healed Pfannenstiel incision. Areas of splotchy skin redness extending a few centimeters across lower edge of the incision, and upper border to lesser extent.  No skin separation, induration or exudate. Extremities: No edema Neurologic: Alert and oriented  LAB RESULTS No results found for this or any previous visit (from the past 24 hour(s)).  IMAGING No results found.  MAU COURSE C/W Dr. Ulanda Edison  ASSESSMENT  1. Cesarean wound infection   Skin inflammation, possibly  early cellulitis 6 wks post op C/S  PLAN Discharge home. See AVS for patient education. Heat to area. Avoid irritation from clothing  Follow-up Information   Follow up with Bovard-Stuckert, Jeral Fruit, MD. (Keep your scheduled prenatal appointment)    Specialty:  Obstetrics and Gynecology   Contact information:   [address] [place] [address] [phone]        Medication List         cephALEXin 500 MG capsule  Commonly known as:  KEFLEX  Take 1 capsule (500 mg total) by mouth 3 (three) times daily.     cetirizine 10 MG tablet  Commonly known as:  ZYRTEC  Take 10 mg by mouth daily.     Fenugreek 500 MG Caps  Take 1,500 mg by mouth 3 (three) times daily.     ibuprofen 800 MG tablet  Commonly known as:  ADVIL,MOTRIN  Take 1 tablet (800 mg total) by mouth every 8 (eight) hours as  needed.     prenatal multivitamin Tabs tablet  Take 1 tablet by mouth at bedtime.       Follow-up Information   Follow up with Bovard-Stuckert, Jeral Fruit, MD. (Keep your scheduled prenatal appointment)    Specialty:  Obstetrics and Gynecology   Contact information:   [address] [place] [address] [phone]        Lorene Dy, CNM 07/31/2013 4:11 PM

## 2013-07-31 NOTE — Discharge Instructions (Signed)
Local heat to the reddened areaWound Infection A wound infection happens when a type of germ (bacteria) starts growing in the wound. In some cases, this can cause the wound to break open. If cared for properly, the infected wound will heal from the inside to the outside. Wound infections need treatment. CAUSES An infection is caused by bacteria growing in the wound.  SYMPTOMS   Increase in redness, swelling, or pain at the wound site.  Increase in drainage at the wound site.  Wound or bandage (dressing) starts to smell bad.  Fever.  Feeling tired or fatigued.  Pus draining from the wound. TREATMENT  You caregiver will prescribe antibiotic medicine. The wound infection should improve within 24 to 48 hours. Any redness around the wound should stop spreading and the wound should be less painful.  HOME CARE INSTRUCTIONS   Only take over-the-counter or prescription medicines for pain, discomfort, or fever as directed by your caregiver.  Take your antibiotics as directed. Finish them even if you start to feel better.  Gently wash the area with mild soap and water 2 times a day, or as directed. Rinse off the soap. Pat the area dry with a clean towel. Do not rub the wound. This may cause bleeding.  Follow your caregiver's instructions for how often you need to change the dressing.  Apply ointment and a dressing to the wound as directed.  If the dressing sticks, moisten it with soapy water and gently remove it.  Change the bandage right away if it becomes wet, dirty, or develops a bad smell.  Take showers. Do not take tub baths, swim, or do anything that may soak the wound until it is healed.  Avoid exercises that make you sweat heavily.  Use anti-itch medicine as directed by your caregiver. The wound may itch when it is healing. Do not pick or scratch at the wound.  Follow up with your caregiver to get your wound rechecked as directed. SEEK MEDICAL CARE IF:  You have an increase  in swelling, pain, or redness around the wound.  You have an increase in the amount of pus coming from the wound.  There is a bad smell coming from the wound.  More of the wound breaks open.  You have a fever. MAKE SURE YOU:   Understand these instructions.  Will watch your condition.  Will get help right away if you are not doing well or get worse. Document Released: 01/04/2003 Document Revised: 06/30/2011 Document Reviewed: 08/11/2010 [facility] Patient Information 2014 [place], [place].

## 2014-02-20 ENCOUNTER — Encounter (HOSPITAL_COMMUNITY): Payer: Self-pay | Admitting: *Deleted

## 2014-05-09 ENCOUNTER — Encounter (HOSPITAL_COMMUNITY): Payer: Self-pay

## 2016-01-30 DIAGNOSIS — F411 Generalized anxiety disorder: Secondary | ICD-10-CM | POA: Diagnosis not present

## 2016-03-11 MED FILL — SERTRALINE HCL 50 MG TABLET: 50 | 90 days supply | Qty: 90 | Fill #0

## 2016-06-27 MED FILL — SERTRALINE HCL 50 MG TABLET: 50 | 90 days supply | Qty: 90 | Fill #1

## 2016-09-09 DIAGNOSIS — F320 Major depressive disorder, single episode, mild: Secondary | ICD-10-CM | POA: Diagnosis not present

## 2016-09-09 DIAGNOSIS — Z124 Encounter for screening for malignant neoplasm of cervix: Secondary | ICD-10-CM | POA: Diagnosis not present

## 2016-09-09 DIAGNOSIS — Z6825 Body mass index (BMI) 25.0-25.9, adult: Secondary | ICD-10-CM | POA: Diagnosis not present

## 2016-09-09 DIAGNOSIS — Z130 Encounter for screening for diseases of the blood and blood-forming organs and certain disorders involving the immune mechanism: Secondary | ICD-10-CM | POA: Diagnosis not present

## 2016-09-09 DIAGNOSIS — Z1389 Encounter for screening for other disorder: Secondary | ICD-10-CM | POA: Diagnosis not present

## 2016-09-09 DIAGNOSIS — Z1151 Encounter for screening for human papillomavirus (HPV): Secondary | ICD-10-CM | POA: Diagnosis not present

## 2016-09-09 DIAGNOSIS — Z01419 Encounter for gynecological examination (general) (routine) without abnormal findings: Secondary | ICD-10-CM | POA: Diagnosis not present

## 2016-09-26 DIAGNOSIS — Z30430 Encounter for insertion of intrauterine contraceptive device: Secondary | ICD-10-CM | POA: Diagnosis not present

## 2016-09-26 DIAGNOSIS — Z3202 Encounter for pregnancy test, result negative: Secondary | ICD-10-CM | POA: Diagnosis not present

## 2016-09-26 MED FILL — SERTRALINE HCL 50 MG TABLET: 50 | 90 days supply | Qty: 90 | Fill #0

## 2016-11-21 DIAGNOSIS — F411 Generalized anxiety disorder: Secondary | ICD-10-CM | POA: Diagnosis not present

## 2016-11-21 DIAGNOSIS — L0212 Furuncle of neck: Secondary | ICD-10-CM | POA: Diagnosis not present

## 2016-11-21 MED FILL — DOXYCYCLINE HYC 100 MG TAB: 100 | 10 days supply | Qty: 20 | Fill #0

## 2016-11-28 DIAGNOSIS — Z30431 Encounter for routine checking of intrauterine contraceptive device: Secondary | ICD-10-CM | POA: Diagnosis not present

## 2016-12-25 MED FILL — SERTRALINE HCL 50 MG TABLET: 50 | 90 days supply | Qty: 90 | Fill #1

## 2017-01-21 DIAGNOSIS — H52223 Regular astigmatism, bilateral: Secondary | ICD-10-CM | POA: Diagnosis not present

## 2017-01-21 DIAGNOSIS — H5213 Myopia, bilateral: Secondary | ICD-10-CM | POA: Diagnosis not present

## 2017-04-01 MED FILL — IBUPROFEN 400 MG TAB: 400 | 5 days supply | Qty: 30 | Fill #0

## 2017-04-01 MED FILL — AMOXICILLIN 500 MG CAPSULE: 500 | 1 days supply | Qty: 4 | Fill #0

## 2017-04-01 MED FILL — CHLORHEXIDINE 0.12% RINSE: 0.12 | 16 days supply | Qty: 473 | Fill #0

## 2017-04-16 MED FILL — SERTRALINE HCL 50 MG TABLET: 50 | 90 days supply | Qty: 90 | Fill #2

## 2017-04-20 HISTORY — PX: WISDOM TOOTH EXTRACTION: SHX21

## 2017-04-20 MED FILL — HYDROCODON-APAP 5-325: 5-325 | 1 days supply | Qty: 7 | Fill #0

## 2017-04-20 MED FILL — AMOXICILLIN 500 MG CAPSULE: 500 | 7 days supply | Qty: 21 | Fill #0

## 2017-05-15 MED FILL — CHLORHEXIDINE 0.12% RINSE: 0.12 | 16 days supply | Qty: 473 | Fill #0

## 2017-07-14 ENCOUNTER — Encounter: Payer: Self-pay | Admitting: Emergency Medicine

## 2017-07-14 ENCOUNTER — Ambulatory Visit: Payer: Self-pay | Admitting: Nurse Practitioner

## 2017-07-14 VITALS — BP 128/64 | HR 84 | Temp 98.4°F | Resp 18 | Wt 147.0 lb

## 2017-07-14 DIAGNOSIS — R509 Fever, unspecified: Secondary | ICD-10-CM

## 2017-07-14 DIAGNOSIS — J069 Acute upper respiratory infection, unspecified: Secondary | ICD-10-CM

## 2017-07-14 LAB — POCT INFLUENZA A/B
Influenza A, POC: NEGATIVE
Influenza B, POC: NEGATIVE

## 2017-07-14 MED ORDER — MONTELUKAST SODIUM 10 MG PO TABS: 10.0000 mg | ORAL_TABLET | Freq: Every day | ORAL | 0 refills | Status: DC

## 2017-07-14 MED ORDER — FLUTICASONE PROPIONATE 50 MCG/ACT NA SUSP: 2.0000 | Freq: Every day | NASAL | 0 refills | Status: DC

## 2017-07-14 MED FILL — FLUTICASONE PROP 50 MCG SPR: 50 | 30 days supply | Qty: 16 | Fill #0

## 2017-07-14 MED FILL — MONTELUKAST SOD 10 MG TAB: 10 | 30 days supply | Qty: 30 | Fill #0

## 2017-07-14 NOTE — Progress Notes (Signed)
Patient ID: Alyssa Guerra, female   DOB: 1986-01-16, 32 y.o.   MRN: [id number] Subjective:  Alyssa Guerra is a 32 y.o. female who presents for evaluation of URI like symptoms.  Symptoms include chills, fever: suspected fevers but not measured at home, headache described as dull, nasal congestion and wheezing.  Onset of symptoms was today  Treatment to date:  ibuprofen.  High risk factors for influenza complications:  patient has a history of asthma and son was diagnosed with influenza..  The following portions of the patient's history were reviewed and updated as appropriate:  allergies, current medications and past medical history.  Constitutional: positive for anorexia, chills, fatigue and fevers, negative for night sweats, sweats and weight loss Eyes: negative Ears, nose, mouth, throat, and face: positive for nasal congestion and sore throat, negative for ear drainage, earaches and hoarseness Respiratory: positive for asthma, cough and wheezing, negative for chronic bronchitis, dyspnea on exertion, sputum and stridor Cardiovascular: negative Gastrointestinal: negative Neurological: positive for headaches, negative for coordination problems, seizures, vertigo and weakness Allergic/Immunologic: positive for hay fever Objective:  BP 128/64 (BP Location: Right Arm, Patient Position: Sitting, Cuff Size: Normal)   Pulse 84   Temp 98.4 F (36.9 C) (Oral)   Resp 18   Wt 147 lb (66.7 kg)   SpO2 99%   BMI 26.04 kg/m  General appearance: alert, cooperative and no distress Head: Normocephalic, without obvious abnormality, atraumatic Eyes: conjunctivae/corneas clear. PERRL, EOM's intact. Fundi benign. Ears: abnormal TM left ear - erythematous and no bulging Nose: clear and copious discharge, turbinates swollen, inflamed, mild maxillary sinus tenderness bilateral, no frontal sinus tenderness bilateral Throat: abnormal findings: mild oropharyngeal erythema Lungs: wheezes posterior - RUL,  inspiratory Heart: regular rate and rhythm, S1, S2 normal, no murmur, click, rub or gallop Abdomen: soft, non-tender; bowel sounds normal; no masses,  no organomegaly Pulses: 2+ and symmetric Skin: Skin color, texture, turgor normal. No rashes or lesions Lymph nodes: cervical and submandibular nodes normal Neurologic: Grossly normal    Assessment:  viral upper respiratory illness    Plan:  Discussed diagnosis and treatment of URI. Discussed the importance of avoiding unnecessary antibiotic therapy. Educational material distributed and questions answered. Suggested symptomatic OTC remedies. Supportive care with appropriate antipyretics and fluids. Nasal saline spray for congestion. Flonase and Singulair per orders. Nasal steroids per orders. Follow up as needed.  Meds ordered this encounter  Medications  . montelukast (SINGULAIR) 10 MG tablet    Sig: Take 1 tablet (10 mg total) by mouth at bedtime.    Dispense:  30 tablet    Refill:  0    Order Specific Question:   Supervising Provider    Answer:   Ricard Dillon [[id number]]  . fluticasone (FLONASE) 50 MCG/ACT nasal spray    Sig: Place 2 sprays into both nostrils daily for 10 days.    Dispense:  16 g    Refill:  0    Order Specific Question:   Supervising Provider    Answer:   Ricard Dillon [phone]

## 2017-07-14 NOTE — Patient Instructions (Signed)
Upper Respiratory Infection, Adult Most upper respiratory infections (URIs) are a viral infection of the air passages leading to the lungs. A URI affects the nose, throat, and upper air passages. The most common type of URI is nasopharyngitis and is typically referred to as "the common cold." URIs run their course and usually go away on their own. Most of the time, a URI does not require medical attention, but sometimes a bacterial infection in the upper airways can follow a viral infection. This is called a secondary infection. Sinus and middle ear infections are common types of secondary upper respiratory infections. Bacterial pneumonia can also complicate a URI. A URI can worsen asthma and chronic obstructive pulmonary disease (COPD). Sometimes, these complications can require emergency medical care and may be life threatening. What are the causes? Almost all URIs are caused by viruses. A virus is a type of germ and can spread from one person to another. What increases the risk? You may be at risk for a URI if:  You smoke.  You have chronic heart or lung disease.  You have a weakened defense (immune) system.  You are very young or very old.  You have nasal allergies or asthma.  You work in crowded or poorly ventilated areas.  You work in health care facilities or schools.  What are the signs or symptoms? Symptoms typically develop 2-3 days after you come in contact with a cold virus. Most viral URIs last 7-10 days. However, viral URIs from the influenza virus (flu virus) can last 14-18 days and are typically more severe. Symptoms may include:  Runny or stuffy (congested) nose.  Sneezing.  Cough.  Sore throat.  Headache.  Fatigue.  Fever.  Loss of appetite.  Pain in your forehead, behind your eyes, and over your cheekbones (sinus pain).  Muscle aches.  How is this diagnosed? Your health care provider may diagnose a URI by:  Physical exam.  Tests to check that your  symptoms are not due to another condition such as: ? Strep throat. ? Sinusitis. ? Pneumonia. ? Asthma.  How is this treated? A URI goes away on its own with time. It cannot be cured with medicines, but medicines may be prescribed or recommended to relieve symptoms. Medicines may help:  Reduce your fever.  Reduce your cough.  Relieve nasal congestion.  Follow these instructions at home:  Take medicines only as directed by your health care provider.  Gargle warm saltwater or take cough drops to comfort your throat as directed by your health care provider.  Use a warm mist humidifier or inhale steam from a shower to increase air moisture. This may make it easier to breathe.  Drink enough fluid to keep your urine clear or pale yellow.  Eat soups and other clear broths and maintain good nutrition.  Rest as needed.  Return to work when your temperature has returned to normal or as your health care provider advises. You may need to stay home longer to avoid infecting others. You can also use a face mask and careful hand washing to prevent spread of the virus.  Increase the usage of your inhaler if you have asthma.  Do not use any tobacco products, including cigarettes, chewing tobacco, or electronic cigarettes. If you need help quitting, ask your health care provider. How is this prevented? The best way to protect yourself from getting a cold is to practice good hygiene.  Avoid oral or hand contact with people with cold symptoms.  Wash your   hands often if contact occurs.  There is no clear evidence that vitamin C, vitamin E, echinacea, or exercise reduces the chance of developing a cold. However, it is always recommended to get plenty of rest, exercise, and practice good nutrition. Contact a health care provider if:  You are getting worse rather than better.  Your symptoms are not controlled by medicine.  You have chills.  You have worsening shortness of breath.  You have  brown or red mucus.  You have yellow or brown nasal discharge.  You have pain in your face, especially when you bend forward.  You have a fever.  You have swollen neck glands.  You have pain while swallowing.  You have white areas in the back of your throat. Get help right away if:  You have severe or persistent: ? Headache. ? Ear pain. ? Sinus pain. ? Chest pain.  You have chronic lung disease and any of the following: ? Wheezing. ? Prolonged cough. ? Coughing up blood. ? A change in your usual mucus.  You have a stiff neck.  You have changes in your: ? Vision. ? Hearing. ? Thinking. ? Mood. This information is not intended to replace advice given to you by your health care provider. Make sure you discuss any questions you have with your health care provider. Document Released: 10/01/2000 Document Revised: 12/09/2015 Document Reviewed: 07/13/2013 Elsevier Interactive Patient Education  2018 [employer]  

## 2017-07-16 ENCOUNTER — Telehealth: Payer: Self-pay | Admitting: Emergency Medicine

## 2017-07-16 MED FILL — SERTRALINE HCL 50 MG TABLET: 50 | 90 days supply | Qty: 90 | Fill #3

## 2017-07-16 NOTE — Telephone Encounter (Signed)
Called to follow up with patient since their visit with [facility], left vm

## 2017-07-28 ENCOUNTER — Other Ambulatory Visit: Payer: Self-pay

## 2017-07-28 ENCOUNTER — Encounter: Payer: Self-pay | Admitting: Family Medicine

## 2017-07-28 ENCOUNTER — Ambulatory Visit (INDEPENDENT_AMBULATORY_CARE_PROVIDER_SITE_OTHER): Payer: 59 | Admitting: Family Medicine

## 2017-07-28 VITALS — BP 110/72 | HR 76 | Temp 98.0°F | Ht 63.0 in | Wt 145.4 lb

## 2017-07-28 DIAGNOSIS — J45909 Unspecified asthma, uncomplicated: Secondary | ICD-10-CM

## 2017-07-28 DIAGNOSIS — F419 Anxiety disorder, unspecified: Secondary | ICD-10-CM | POA: Diagnosis not present

## 2017-07-28 DIAGNOSIS — J302 Other seasonal allergic rhinitis: Secondary | ICD-10-CM

## 2017-07-28 DIAGNOSIS — Z7689 Persons encountering health services in other specified circumstances: Secondary | ICD-10-CM

## 2017-07-28 MED ORDER — MONTELUKAST SODIUM 10 MG PO TABS: 10.0000 mg | ORAL_TABLET | Freq: Every day | ORAL | 3 refills | Status: DC

## 2017-07-28 NOTE — Assessment & Plan Note (Addendum)
Adequately controlled  Con't Sertraline 50 mg Daily

## 2017-07-28 NOTE — Progress Notes (Signed)
Patient ID: Alyssa M Krizek MRN: [id number], DOB: [birth year], 32 y.o. Date of Encounter: 07/28/17   Chief Complaint:  Establish care with PCP  Chief Complaint  Patient presents with  . Establish Care    Patient had previous PCP at [facility]. Patient wouldl like to discuss getting routine labs.     HPI: Alyssa Guerra is a  32 y.o. y/o female presents to establish care.  She has previously been managed by her OB/GYN for her annual well woman's, and prior to that was with a PCP at [facility].  She has history of anxiety, seasonal allergies, asthma.  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  She desires to get baseline lab work today.  She has no complaints of fatigue, weight gain, near syncope, palpitations.    She was diagnosed with anxiety in 2015 shortly after the birth of her second child.  Her OB/GYN had been treating her with sertraline 50 mg daily.  They recently discussed weaning down her dose however patient has had recent increased stresses in her life with balancing work, home life and Presenter, broadcasting.  She desires to possibly wean down her dose next year after finishing the nursing program.  She denies any depression and feels that her anxiety is currently controlled.  When she feels anxious or is having difficulty getting to sleep over-the-counter supplements like melatonin are effective.  Has a history of mild intermittent asthma which has recently been more difficult to control.  Her seasonal allergies were treated more aggressively with Flonase and Singulair and this has improved her nasal congestion, breathing at night and use of her inhaler.  She states that she rarely uses her rescue inhaler.  Last month she has not woken up at night and needed her  inhaler.  And she cannot member the last time she refilled her albuterol inhaler prescription.    Her father is alive without any medical history.  Her mother is also living and has some coronary artery disease or cardiovascular disease however specific diagnoses are not known.    Patient notes that she is sensitive to medications such as sedation for procedures.  Over-the-counter medications such as decongestants will make her feel jittery.  She does get very jittery when using her albuterol inhaler so she tries not to use it unless she has a severe asthma exacerbation.  The sensitivity to her body she believes is also related to how well she is able to tell if she is getting to get sick with something.  Pt is G2P2002, currently has mirena IUD.  Husband got a vasectomy.      Past Medical History:  Diagnosis Date  . Asthma   . Heartburn in pregnancy   . S/P cesarean section 06/23/2013  . Seasonal allergies      Past Surgical History:  Procedure Laterality Date  . CESAREAN SECTION  02/15/2011   Procedure: CESAREAN SECTION;  Surgeon: Melina Schools, MD;  Location: [facility];  Service: Gynecology;  Laterality: N/A;  primary of baby boy  at 2054  APGAR 8/9  . CESAREAN SECTION N/A 06/23/2013   Procedure: REPEAT CESAREAN SECTION;  Surgeon: Thornell Sartorius, MD;  Location: [facility];  Service: Obstetrics;  Laterality: N/A;  1 1/2hrs OR time    Home Meds:  Outpatient Medications Prior to Visit  Medication Sig Dispense Refill  . albuterol (PROVENTIL HFA;VENTOLIN HFA) 108 (90 Base) MCG/ACT inhaler Inhale into the lungs every 6 (six) hours as needed for wheezing or shortness of breath.    . cetirizine (ZYRTEC) 10 MG tablet Take 10 mg by mouth daily.      . montelukast (SINGULAIR) 10 MG tablet Take 1 tablet (10 mg total) by mouth at bedtime. 30 tablet 0  . sertraline (ZOLOFT) 50 MG tablet   5  . cephALEXin (KEFLEX) 500 MG capsule Take 1 capsule (500 mg total) by mouth 3 (three) times daily. (Patient not  taking: Reported on 07/14/2017) 21 capsule 0  . Fenugreek 500 MG CAPS Take 1,500 mg by mouth 3 (three) times daily.    . fluticasone (FLONASE) 50 MCG/ACT nasal spray Place 2 sprays into both nostrils daily for 10 days. 16 g 0  . ibuprofen (ADVIL,MOTRIN) 800 MG tablet Take 1 tablet (800 mg total) by mouth every 8 (eight) hours as needed. 30 tablet 0  . Prenatal Vit-Fe Fumarate-FA (PRENATAL MULTIVITAMIN) TABS Take 1 tablet by mouth at bedtime.      No facility-administered medications prior to visit.     Allergies: Allergies  Allergen Reactions  . Other Other (See Comments)    Cat Dander    Social History   Socioeconomic History  . Marital status: Married    Spouse name: Not on file  . Number of children: Not on file  . Years of education: Not on file  . Highest education level: Not on file  Occupational History  . Not on file  Social Needs  . Financial resource strain: Not on file  . Food insecurity:    Worry: Not on file    Inability: Not on file  . Transportation needs:    Medical: Not on file    Non-medical: Not on file  Tobacco Use  . Smoking status: Never Smoker  . Smokeless tobacco: Never Used  Substance and Sexual Activity  . Alcohol use: Yes    Comment: none with pregnancy  . Drug use: No  . Sexual activity: Yes    Birth control/protection: None    Comment: pregnant  Lifestyle  . Physical activity:    Days per week: Not on file    Minutes per session: Not on file  . Stress: Not on file  Relationships  . Social connections:    Talks on phone: Not on file    Gets together: Not on file    Attends religious service: Not on file    Active member of club or organization: Not on file    Attends meetings of clubs or organizations: Not on file    Relationship status: Not on file  . Intimate partner violence:    Fear of current or ex partner: Not on file    Emotionally abused: Not on file    Physically abused: Not on file    Forced sexual activity: Not on file   Other Topics Concern  . Not on file  Social History Narrative  . Not on file    Family History  Problem Relation Age of Onset  . Diabetes Maternal Grandmother   . Cancer Maternal Grandmother   . Diabetes Maternal Grandfather     Review of Systems  Constitutional: Negative.  Negative for chills, diaphoresis, malaise/fatigue and weight loss.  HENT: Negative.   Eyes: Negative.  Negative for blurred vision.  Respiratory: Negative.  Negative for shortness of breath  and wheezing.   Cardiovascular: Negative.  Negative for chest pain, palpitations, orthopnea and leg swelling.  Gastrointestinal: Negative.  Negative for heartburn.  Genitourinary: Negative.   Musculoskeletal: Negative.   Skin: Negative.   Neurological: Negative.   Endo/Heme/Allergies: Negative.   Psychiatric/Behavioral: Negative for depression, memory loss, substance abuse and suicidal ideas. The patient is not nervous/anxious and does not have insomnia.   All other systems reviewed and are negative.    OBJECTIVE:   Vitals:   07/28/17 1453  BP: 110/72  Pulse: 76  Temp: 98 F (36.7 C)  TempSrc: Oral  SpO2: 99%  Weight: 145 lb 6 oz (65.9 kg)  Height: 5\' 3"  (1.6 m)   Body mass index is 25.75 kg/m.  Physical Exam  Constitutional: She is oriented to person, place, and time and well-developed, well-nourished, and in no distress. No distress.  HENT:  Head: Normocephalic and atraumatic.  Right Ear: Tympanic membrane, external ear and ear canal normal.  Left Ear: Tympanic membrane, external ear and ear canal normal.  Nose: Mucosal edema present.  Mouth/Throat: Uvula is midline, oropharynx is clear and moist and mucous membranes are normal.  Eyes: Pupils are equal, round, and reactive to light. Conjunctivae are normal.  Neck: Normal range of motion and phonation normal. Neck supple. No tracheal deviation present. No thyroid mass and no thyromegaly present.  Cardiovascular: Normal rate, regular rhythm and intact  distal pulses. Exam reveals no gallop and no friction rub.  No murmur heard. Pulses:      Carotid pulses are 2+ on the right side, and 2+ on the left side.      Posterior tibial pulses are 2+ on the right side, and 2+ on the left side.  No le edema  Pulmonary/Chest: Effort normal. No accessory muscle usage or stridor. No respiratory distress. She has no decreased breath sounds. She has no wheezes. She has no rhonchi. She has no rales. She exhibits no tenderness.  Abdominal: Soft. Bowel sounds are normal. She exhibits no distension. There is no tenderness. There is no rebound and no guarding.  Musculoskeletal: Normal range of motion.  Lymphadenopathy:    She has no cervical adenopathy.  Neurological: She is alert and oriented to person, place, and time. She displays facial symmetry. She exhibits normal muscle tone. Gait normal. Coordination normal.  Skin: Skin is warm, dry and intact. No rash noted. She is not diaphoretic. No cyanosis. No pallor. Nails show no clubbing.  Psychiatric: Mood, memory, affect and judgment normal. Her mood appears not anxious. She does not exhibit a depressed mood.  Nursing note and vitals reviewed.   Depression screen PHQ 2/9 07/28/2017  Decreased Interest 0  Down, Depressed, Hopeless 0  PHQ - 2 Score 0      ASSESSMENT AND PLAN:  32 y.o. y/o female here to establish care  A. Screening Labs: Baseline labs, CBC, CMP, Lipid Will do fasting labs  B. Pap:  done 09/09/2016 with OBGYN Dr. Melba Coon  C. Screening Mammogram: N/D  D. DEXA/BMD: N/D  E. Colorectal Cancer Screening:  N/A  F. Immunizations:   Chart reviewed: immunizations are up to date and documented:  Influenza:  11/19/2017  Tetanus:   04/07/2013  Pneumococcal:  n/a  Zostavax:  N/a  Pt can do CPE after 09/09/16, will call later to schedule.  Pt wished for labs, she will call office to arrange prior to this CPE appt.  Problem List Items Addressed This Visit      Respiratory   Asthma  worsened with seasonal allergies 07/2017 + singulaire SABA PRN      Relevant Medications   albuterol (PROVENTIL HFA;VENTOLIN HFA) 108 (90 Base) MCG/ACT inhaler   montelukast (SINGULAIR) 10 MG tablet     Other   Seasonal allergies    Worsening this season, not controlled with OTC meds + singulaire      Relevant Medications   montelukast (SINGULAIR) 10 MG tablet   Anxiety    Adequately controlled  Con't Sertraline 50 mg Daily      Relevant Medications   sertraline (ZOLOFT) 50 MG tablet    Other Visit Diagnoses    Establishing care with new doctor, encounter for    -  Primary   Relevant Orders   CBC with Differential/Platelet   COMPLETE METABOLIC PANEL WITH GFR   Lipid panel         Signed,  Delsa Grana PA-C 07/28/17 3:06 PM

## 2017-07-31 ENCOUNTER — Encounter: Payer: Self-pay | Admitting: Family Medicine

## 2017-07-31 NOTE — Assessment & Plan Note (Signed)
Worsening this season, not controlled with OTC meds + singulaire

## 2017-07-31 NOTE — Assessment & Plan Note (Signed)
worsened with seasonal allergies 07/2017 + singulaire SABA PRN

## 2017-08-10 ENCOUNTER — Other Ambulatory Visit: Payer: 59

## 2017-08-10 DIAGNOSIS — Z7689 Persons encountering health services in other specified circumstances: Secondary | ICD-10-CM | POA: Diagnosis not present

## 2017-08-11 LAB — LIPID PANEL
Cholesterol: 167 mg/dL (ref <200)
HDL: 59 mg/dL (ref >50)
LDL Cholesterol (Calc): 93 mg/dL (calc)
Non-HDL Cholesterol (Calc): 108 mg/dL (calc) (ref <130)
Total CHOL/HDL Ratio: 2.8 (calc) (ref <5.0)
Triglycerides: 67 mg/dL (ref <150)

## 2017-08-11 LAB — COMPLETE METABOLIC PANEL WITH GFR
AG Ratio: 1.8 (calc) (ref 1.0–2.5)
ALT: 11 U/L (ref 6–29)
AST: 11 U/L (ref 10–30)
Albumin: 4.5 g/dL (ref 3.6–5.1)
Alkaline phosphatase (APISO): 37 U/L (ref 33–115)
BUN: 10 mg/dL (ref 7–25)
CO2: 29 mmol/L (ref 20–32)
Calcium: 9.7 mg/dL (ref 8.6–10.2)
Chloride: 104 mmol/L (ref 98–110)
Creat: 0.66 mg/dL (ref 0.50–1.10)
GFR, Est African American: 135 mL/min/{1.73_m2} (ref >=60)
GFR, Est Non African American: 116 mL/min/{1.73_m2} (ref >=60)
Globulin: 2.5 g/dL (calc) (ref 1.9–3.7)
Glucose, Bld: 93 mg/dL (ref 65–99)
Potassium: 4.3 mmol/L (ref 3.5–5.3)
Sodium: 139 mmol/L (ref 135–146)
Total Bilirubin: 0.2 mg/dL (ref 0.2–1.2)
Total Protein: 7 g/dL (ref 6.1–8.1)

## 2017-08-11 LAB — CBC WITH DIFFERENTIAL/PLATELET
Basophils Absolute: 49 cells/uL (ref 0–200)
Basophils Relative: 0.8 %
Eosinophils Absolute: 384 cells/uL (ref 15–500)
Eosinophils Relative: 6.3 %
HCT: 39.4 % (ref 35.0–45.0)
Hemoglobin: 13.3 g/dL (ref 11.7–15.5)
Lymphs Abs: 2105 cells/uL (ref 850–3900)
MCH: 29.3 pg (ref 27.0–33.0)
MCHC: 33.8 g/dL (ref 32.0–36.0)
MCV: 86.8 fL (ref 80.0–100.0)
MPV: 10.1 fL (ref 7.5–12.5)
Monocytes Relative: 5.9 %
Neutro Abs: 3203 cells/uL (ref 1500–7800)
Neutrophils Relative %: 52.5 %
Platelets: 282 10*3/uL (ref 140–400)
RBC: 4.54 10*6/uL (ref 3.80–5.10)
RDW: 12.2 % (ref 11.0–15.0)
Total Lymphocyte: 34.5 %
WBC mixed population: 360 cells/uL (ref 200–950)
WBC: 6.1 10*3/uL (ref 3.8–10.8)

## 2017-08-11 NOTE — Progress Notes (Signed)
All screening labs normal and look great, electrolytes, cell counts, liver function, kidney function, lipids (all aspects optimal)

## 2017-08-19 MED FILL — MONTELUKAST SOD 10 MG TAB: 10 | 90 days supply | Qty: 90 | Fill #0

## 2017-11-17 MED FILL — MONTELUKAST SOD 10 MG TAB: 10 | 30 days supply | Qty: 30 | Fill #1

## 2017-12-03 NOTE — Telephone Encounter (Signed)
error 

## 2017-12-08 ENCOUNTER — Other Ambulatory Visit: Payer: Self-pay | Admitting: Nurse Practitioner

## 2017-12-18 ENCOUNTER — Other Ambulatory Visit: Payer: Self-pay | Admitting: Family Medicine

## 2017-12-18 ENCOUNTER — Other Ambulatory Visit: Payer: Self-pay | Admitting: Nurse Practitioner

## 2017-12-18 DIAGNOSIS — J302 Other seasonal allergic rhinitis: Secondary | ICD-10-CM

## 2017-12-18 MED FILL — MONTELUKAST SOD 10 MG TAB: 10 | 30 days supply | Qty: 30 | Fill #0

## 2017-12-24 DIAGNOSIS — Z130 Encounter for screening for diseases of the blood and blood-forming organs and certain disorders involving the immune mechanism: Secondary | ICD-10-CM | POA: Diagnosis not present

## 2017-12-24 DIAGNOSIS — Z01419 Encounter for gynecological examination (general) (routine) without abnormal findings: Secondary | ICD-10-CM | POA: Diagnosis not present

## 2017-12-24 DIAGNOSIS — Z30431 Encounter for routine checking of intrauterine contraceptive device: Secondary | ICD-10-CM | POA: Diagnosis not present

## 2017-12-24 DIAGNOSIS — Z6824 Body mass index (BMI) 24.0-24.9, adult: Secondary | ICD-10-CM | POA: Diagnosis not present

## 2017-12-24 DIAGNOSIS — Z1389 Encounter for screening for other disorder: Secondary | ICD-10-CM | POA: Diagnosis not present

## 2018-01-12 MED FILL — MONTELUKAST SOD 10 MG TAB: 10 | 30 days supply | Qty: 30 | Fill #1

## 2018-02-18 ENCOUNTER — Other Ambulatory Visit: Payer: Self-pay | Admitting: Nurse Practitioner

## 2018-02-24 MED FILL — MONTELUKAST SOD 10 MG TAB: 10 | 30 days supply | Qty: 30 | Fill #2

## 2018-02-26 MED FILL — SERTRALINE HCL 50 MG TABLET: 50 | 90 days supply | Qty: 90 | Fill #0

## 2018-03-24 MED FILL — MONTELUKAST SOD 10 MG TAB: 10 | 30 days supply | Qty: 30 | Fill #3

## 2018-03-26 MED FILL — FLUTICASONE PROP 50 MCG SPR: 50 | 60 days supply | Qty: 16 | Fill #0

## 2018-04-22 ENCOUNTER — Other Ambulatory Visit: Payer: Self-pay | Admitting: Family Medicine

## 2018-04-22 DIAGNOSIS — J302 Other seasonal allergic rhinitis: Secondary | ICD-10-CM

## 2018-04-22 MED FILL — MONTELUKAST SOD 10 MG TAB: 10 | 30 days supply | Qty: 30 | Fill #0

## 2018-05-06 ENCOUNTER — Ambulatory Visit: Payer: 59 | Admitting: Family Medicine

## 2018-05-14 ENCOUNTER — Encounter: Payer: Self-pay | Admitting: Family Medicine

## 2018-05-14 ENCOUNTER — Ambulatory Visit (INDEPENDENT_AMBULATORY_CARE_PROVIDER_SITE_OTHER): Payer: No Typology Code available for payment source | Admitting: Family Medicine

## 2018-05-14 VITALS — BP 110/72 | HR 71 | Temp 98.7°F | Ht 63.0 in | Wt 145.5 lb

## 2018-05-14 DIAGNOSIS — J4521 Mild intermittent asthma with (acute) exacerbation: Secondary | ICD-10-CM | POA: Diagnosis not present

## 2018-05-14 DIAGNOSIS — T781XXA Other adverse food reactions, not elsewhere classified, initial encounter: Secondary | ICD-10-CM

## 2018-05-14 MED ORDER — EPINEPHRINE 0.3 MG/0.3ML IJ SOAJ: 0.3000 mg | INTRAMUSCULAR | 1 refills | Status: DC | PRN

## 2018-05-14 MED FILL — EPINEPHRINE 0.3 MG AUTO-INJ: 0.3 | 30 days supply | Qty: 2 | Fill #0

## 2018-05-14 NOTE — Patient Instructions (Addendum)
For any possible allergic reactions causing hives, wheeze, shortness of breath, sensation of throat closure -    ASAP:   Take 50 mg benadryl by mouth   Take 20 mg pepcid if you have on hand  IF you have stridor, sensation of throat closure that worsens making it difficult to breath, swallow or speak - administer epi pen and call 911.  If you do not feel like you need to call 911 you still need to go to the ER or an urgent care for observation - mostly observation is to make sure that as epi wears off, you do not have worsening reaction.    Look up a [facility] allergist provider -   CALL [facility] to notify them that your PCP has notated referral to allergist, and tell [facility] what allergist you are going to [facility] needs 24 hours notice Call specialist and set up your own appointment - arrange any records that they may want -  Let us know.    Anaphylactic Reaction, Adult An anaphylactic reaction (anaphylaxis) is a sudden, serious allergic reaction. This affects more than one part of your body. It can be life-threatening. If you have an anaphylactic reaction, you need to get medical help right away. What are the causes? This condition is caused by exposure to things that give you an allergic reaction (allergens). Common allergens include:  Foods, such as peanuts, wheat, shellfish, milk, and eggs.  Medicines.  Insect bites or stings.  Blood or parts of blood received for treatment (transfusions).  Chemicals, such as latex and dyes that are used in food and in medical tests. What are the signs or symptoms? Signs of an anaphylactic reaction may include:  Feeling warm in the face (flushed). Your face may turn red.  Itchy, red, swollen areas of skin (hives).  Swelling of the: ? Eyes. ? Lips. ? Face. ? Mouth. ? Tongue. ? Throat.  Trouble with any of these: ? Breathing. ? Talking. ? Swallowing.  Loud breathing (wheezing).  Feeling dizzy or light-headed.  Passing out  (fainting).  Pain or cramps in your belly.  Throwing up (vomiting).  Watery poop (diarrhea). How is this diagnosed? This condition is diagnosed based on:  Your symptoms.  A physical exam.  Blood tests.  Recent exposure to things that give you an allergic reaction. How is this treated? If you think you are having an anaphylactic reaction, you should do this right away:  Give yourself a shot of medicine (epinephrine) using an auto-injector "pen." Your doctor will teach you how to use this pen.  Call for emergency help. If you use a pen, you must still get treated in the hospital. There, you may be given: ? Medicines. ? Oxygen. ? Fluids in an IV tube. Follow these instructions at home: Safety  Always keep an auto-injector pen with you. This could save your life. Use it as told by your doctor.  Do not drive after a reaction. Wait until your doctor says it is safe to drive.  Make sure that you, the people who live with you, and your employer know: ? What you are allergic to, so you can stay away from it. ? How to use your auto-injector pen.  Wear a bracelet or necklace that says you have an allergy, if your doctor tells you to do this.  Learn the signs of a very bad allergic reaction. This way, you can treat it right away.  Work with your doctors to make a plan for what to  do if you have a very bad reaction. It is important to be ready. If you use your auto-injector pen:   Get more medicine (epinephrine) for your pen right away. This is important in case you have another reaction.  Get help right away. To avoid a serious allergic reaction:  Avoid things that gave you a very bad allergic reaction before.  Tell your server about your allergy when you go out to eat. If you are not sure if your meal has food that you are allergic to, ask your server before you eat it. General instructions  Take over-the-counter and prescription medicines only as told by your  doctor.  If you have itchy, red, swollen areas of skin or a rash: ? Use an over-the-counter medicine (antihistamine) as told by your doctor. ? Put cold, wet cloths on your skin. ? Take a cool bath or shower. Avoid hot water.  Tell all doctors who care for you that you have an allergy.  Keep all follow-up visits as told by your doctor. This is important. Get help right away if:  You have signs of an allergic reaction. You may notice them soon after being exposed to things that give you an allergic reaction. Signs may include: ? Warmth in your face. Your face may turn red. ? Itchy, red, swollen areas of skin. ? Swelling of your:  Eyes.  Lips.  Face.  Mouth.  Tongue.  Throat. ? Trouble with any of these:  Breathing.  Talking.  Swallowing. ? Loud breathing (wheezing). ? Feeling dizzy or light-headed. ? Passing out. ? Pain or cramps in your belly. ? Throwing up. ? Watery poop.  You had to use your auto-injector pen. You must go to the emergency room even if the medicine seems to be working. This is because another allergic reaction may happen within 3 days (rebound anaphylaxis). These symptoms may be an emergency. Do not wait to see if the symptoms will go away. Do this right away:  Use your auto-injector pen as you have been told.  Get medical help. Call your local emergency services (911 in the [place] Do not drive yourself to the hospital. Summary  An anaphylactic reaction (anaphylaxis) is a sudden, serious allergic reaction.  This condition can be life-threatening. If you have a reaction, get medical help right away.  Your doctor will show you how to give yourself a shot (epinephrine injection) with an auto-injector "pen."  Always keep an auto-injector pen with you. It could save your life. Use it as told by your doctor.  If you had to use your auto-injector pen, you must go to the emergency room. Go there even if the medicine seems to be working. This  information is not intended to replace advice given to you by your health care provider. Make sure you discuss any questions you have with your health care provider. Document Released: 09/24/2007 Document Revised: 07/30/2017 Document Reviewed: 07/30/2017 Elsevier Interactive Patient Education  [employer].

## 2018-05-14 NOTE — Progress Notes (Signed)
Patient ID: Alyssa Guerra, female    DOB: [birth year], 33 y.o.   MRN: [id number]  PCP: Delsa Grana, PA-C  Chief Complaint  Patient presents with  . Referral    Patient in for a referral to allergist for possible allergy to oranges    Subjective:   Alyssa Guerra is a 33 y.o. female, presents to clinic with CC of asthma exacerbations and suspected allergy to oranges.  Throughout December she was using albuterol more frequently almost daily, had more wheeze and SOB in the absence of any illness or noticeable allergies.   She is a L&D nurse and one day at work she used some essential oils with orange and ginger and she had severe wheeze, SOB, could not talk or speak or eat she was so distressed.  She used her inhaler multiple times and took benadryl and it took almost 4 hours before she felt a little better.  She had no hives, lip swelling, V, HA, stridor at that time.  She did notice that preparig and giving her kids cuties or oranges at home would also trigger her asthma.  She has since avoided all oranges and now feels back to her baseline.  She uses SABA now once a week.  She is on zyrtec and singulair, uses flonase PRN.  Currently no illness respiratory or nasal sx.  She wants to go to an allergist to get tested.   Asthma hx - no past hospitalizations, intubations, use of epi Other triggers - cats   Patient Active Problem List   Diagnosis Date Noted  . Asthma 07/28/2017  . Seasonal allergies 07/28/2017  . Anxiety 07/28/2017     Prior to Admission medications   Medication Sig Start Date End Date Taking? Authorizing Provider  albuterol (PROVENTIL HFA;VENTOLIN HFA) 108 (90 Base) MCG/ACT inhaler Inhale into the lungs every 6 (six) hours as needed for wheezing or shortness of breath.   Yes [provider]  cetirizine (ZYRTEC) 10 MG tablet Take 10 mg by mouth daily.     Yes [provider]  montelukast (SINGULAIR) 10 MG tablet TAKE 1 TABLET BY MOUTH AT BEDTIME  04/22/18  Yes Delsa Grana, PA-C  sertraline (ZOLOFT) 50 MG tablet  07/16/17  Yes [provider]     Allergies  Allergen Reactions  . Other Other (See Comments)    Cat Dander     Family History  Problem Relation Age of Onset  . CAD Mother   . Diabetes Maternal Grandmother   . Cancer - Ovarian Maternal Grandmother        ovarian? cervical?  . CVA Maternal Grandmother   . Diabetes Maternal Grandfather      Social History   Socioeconomic History  . Marital status: Married    Spouse name: Not on file  . Number of children: Not on file  . Years of education: Not on file  . Highest education level: Not on file  Occupational History  . Not on file  Social Needs  . Financial resource strain: Not on file  . Food insecurity:    Worry: Not on file    Inability: Not on file  . Transportation needs:    Medical: Not on file    Non-medical: Not on file  Tobacco Use  . Smoking status: Never Smoker  . Smokeless tobacco: Never Used  Substance and Sexual Activity  . Alcohol use: Yes    Alcohol/week: 2.0 standard drinks    Types: 1 Cans of  beer, 1 Glasses of wine per week    Comment: none with pregnancy  . Drug use: No  . Sexual activity: Yes    Birth control/protection: None, I.U.D.    Comment: husband vasectomy  Lifestyle  . Physical activity:    Days per week: Not on file    Minutes per session: Not on file  . Stress: Not on file  Relationships  . Social connections:    Talks on phone: Not on file    Gets together: Not on file    Attends religious service: Not on file    Active member of club or organization: Not on file    Attends meetings of clubs or organizations: Not on file    Relationship status: Not on file  . Intimate partner violence:    Fear of current or ex partner: Not on file    Emotionally abused: Not on file    Physically abused: Not on file    Forced sexual activity: Not on file  Other Topics Concern  . Not on file  Social History  Narrative  . Not on file     Review of Systems  Constitutional: Negative.   HENT: Negative.   Eyes: Negative.   Respiratory: Negative.   Cardiovascular: Negative.   Gastrointestinal: Negative.   Endocrine: Negative.   Genitourinary: Negative.   Musculoskeletal: Negative.   Skin: Negative.   Allergic/Immunologic: Negative.   Neurological: Negative.   Hematological: Negative.   Psychiatric/Behavioral: Negative.   All other systems reviewed and are negative.      Objective:    Vitals:   05/14/18 0813  BP: 110/72  Pulse: 71  Temp: 98.7 F (37.1 C)  TempSrc: Oral  SpO2: 98%  Weight: 145 lb 8 oz (66 kg)  Height: 5\' 3"  (1.6 m)      Physical Exam Vitals signs and nursing note reviewed.  Constitutional:      General: She is not in acute distress.    Appearance: Normal appearance. She is well-developed. She is not ill-appearing, toxic-appearing or diaphoretic.  HENT:     Head: Normocephalic and atraumatic.     Right Ear: Tympanic membrane, ear canal and external ear normal.     Left Ear: Tympanic membrane, ear canal and external ear normal.     Nose: Nose normal. No congestion or rhinorrhea.     Right Turbinates: Not enlarged, swollen or pale.     Left Turbinates: Not enlarged, swollen or pale.     Mouth/Throat:     Mouth: Mucous membranes are moist.     Pharynx: Uvula midline. No oropharyngeal exudate or posterior oropharyngeal erythema.  Eyes:     General: Lids are normal.     Conjunctiva/sclera: Conjunctivae normal.     Pupils: Pupils are equal, round, and reactive to light.  Neck:     Musculoskeletal: Normal range of motion and neck supple.     Trachea: Trachea and phonation normal. No tracheal deviation.  Cardiovascular:     Rate and Rhythm: Normal rate and regular rhythm.     Pulses:          Posterior tibial pulses are 2+ on the right side and 2+ on the left side.     Heart sounds: Normal heart sounds.  Pulmonary:     Effort: Pulmonary effort is  normal. No respiratory distress.     Breath sounds: Normal breath sounds. No stridor. No wheezing, rhonchi or rales.  Chest:     Chest wall: No tenderness.  Abdominal:     General: Bowel sounds are normal. There is no distension.     Palpations: Abdomen is soft.     Tenderness: There is no abdominal tenderness. There is no guarding or rebound.  Musculoskeletal: Normal range of motion.        General: No deformity.  Lymphadenopathy:     Cervical: No cervical adenopathy.  Skin:    General: Skin is warm and dry.     Capillary Refill: Capillary refill takes less than 2 seconds.     Coloration: Skin is not pale.     Findings: No rash.  Neurological:     Mental Status: She is alert and oriented to person, place, and time.     Motor: No abnormal muscle tone.     Gait: Gait normal.  Psychiatric:        Mood and Affect: Mood normal.        Speech: Speech normal.        Behavior: Behavior normal.           Assessment & Plan:      ICD-10-CM   1. Mild intermittent asthma with exacerbation J45.21 EPINEPHrine 0.3 mg/0.3 mL IJ SOAJ injection   controlled with SABA, singulair - controlled with avoiding triggers  2. Allergic reaction to food, initial encounter T78.1XXA EPINEPHrine 0.3 mg/0.3 mL IJ SOAJ injection   orange allergy vs irritant to cause acute asthma exacerbation?  epi Rx'd, tx for anaphylaxis reviewed, pt going to allergy/asthma specialist    Pt going to specialist - [place] insurance - specialist in allergy/asthma for new sx/possible irritants/allergic reaction.  The following management of allergic reaction/asthma triggers and epi pen indications and follow up reviewed with the pt and printed for her: For any possible allergic reactions causing hives, wheeze, shortness of breath, sensation of throat closure -    ASAP:   Take 50 mg benadryl by mouth   Take 20 mg pepcid if you have on hand  IF you have stridor, sensation of throat closure that worsens making it difficult  to breath, swallow or speak - administer epi pen and call 911.  If you do not feel like you need to call 911 you still need to go to the ER or an urgent care for observation - mostly observation is to make sure that as epi wears off, you do not have worsening reaction.    Look up a [facility] allergist provider -   CALL [facility] to notify them that your PCP has notated referral to allergist, and tell [facility] what allergist you are going to [facility] needs 24 hours notice Call specialist and set up your own appointment - arrange any records that they may want -  Let us know.  Encouraged her to get further administration education from pharmacist dispensing epi pen.   All questions asked and answered.  Delsa Grana, PA-C 05/14/18 8:31 AM

## 2018-05-24 MED FILL — MONTELUKAST SOD 10 MG TAB: 10 | 30 days supply | Qty: 30 | Fill #1

## 2018-06-01 MED FILL — SERTRALINE HCL 50 MG TABLET: 50 | 90 days supply | Qty: 90 | Fill #0

## 2018-06-21 MED FILL — OSELTAMIVIR PHOSPHATE 75 MG: 75 | 5 days supply | Qty: 10 | Fill #0

## 2018-06-28 MED FILL — MONTELUKAST SOD 10 MG TAB: 10 | 30 days supply | Qty: 30 | Fill #2

## 2018-07-19 ENCOUNTER — Telehealth: Payer: No Typology Code available for payment source | Admitting: Family

## 2018-07-19 DIAGNOSIS — J019 Acute sinusitis, unspecified: Secondary | ICD-10-CM

## 2018-07-19 DIAGNOSIS — B9789 Other viral agents as the cause of diseases classified elsewhere: Secondary | ICD-10-CM

## 2018-07-19 MED ORDER — FLUTICASONE PROPIONATE 50 MCG/ACT NA SUSP: 2.0000 | Freq: Every day | NASAL | 6 refills | Status: DC

## 2018-07-19 MED FILL — FLUTICASONE PROP 50 MCG SPR: 50 | 30 days supply | Qty: 16 | Fill #0

## 2018-07-19 NOTE — Progress Notes (Signed)

## 2018-07-21 MED FILL — MONTELUKAST SOD 10 MG TAB: 10 | 30 days supply | Qty: 30 | Fill #3

## 2018-08-08 ENCOUNTER — Encounter: Payer: Self-pay | Admitting: Family Medicine

## 2018-08-08 DIAGNOSIS — J45909 Unspecified asthma, uncomplicated: Secondary | ICD-10-CM

## 2018-08-10 MED ORDER — ALBUTEROL SULFATE HFA 108 (90 BASE) MCG/ACT IN AERS: 2.0000 | INHALATION_SPRAY | RESPIRATORY_TRACT | 3 refills | Status: DC | PRN

## 2018-08-10 MED FILL — ALBUTEROL SULFATE HFA 108 (: 108 (90 BAS | 16 days supply | Qty: 18 | Fill #0

## 2018-08-12 MED FILL — SERTRALINE HCL 50 MG TABLET: 50 | 90 days supply | Qty: 90 | Fill #1

## 2018-08-20 ENCOUNTER — Other Ambulatory Visit: Payer: Self-pay | Admitting: Family Medicine

## 2018-08-20 DIAGNOSIS — J302 Other seasonal allergic rhinitis: Secondary | ICD-10-CM

## 2018-08-20 MED FILL — MONTELUKAST SOD 10 MG TAB: 10 | 30 days supply | Qty: 30 | Fill #0

## 2018-09-14 MED FILL — MONTELUKAST SOD 10 MG TAB: 10 | 30 days supply | Qty: 30 | Fill #1

## 2018-09-16 ENCOUNTER — Telehealth: Payer: 59 | Admitting: Physician Assistant

## 2018-09-16 DIAGNOSIS — T148XXA Other injury of unspecified body region, initial encounter: Secondary | ICD-10-CM

## 2018-09-16 NOTE — Progress Notes (Signed)
Based on what you shared with me, I feel your condition warrants further evaluation and I recommend that you be seen for a face to face office visit.  Symptoms/imaging are not consistent with a true cellulitis. The wound itself may have mild infection but my concern is that there may be some residual material in the area causing it to remain inflamed. I would recommend you be seen so the area can be examined, cleaned well and the most appropriate medication regimen be given.   NOTE: If you entered your credit card information for this eVisit, you will not be charged. You may see a "hold" on your card for the $35 but that hold will drop off and you will not have a charge processed.  If you are having a true medical emergency please call 911.  If you need an urgent face to face visit, [place] has four urgent care centers for your convenience.    PLEASE NOTE: THE [facility] LOCATIONS AND URGENT CARE CLINICS DO NOT HAVE THE TESTING FOR CORONAVIRUS COVID19 AVAILABLE.  IF YOU FEEL YOU NEED THIS TEST YOU MUST GO TO A TRIAGE LOCATION AT [facility]   [email or web address] to reserve your spot online an avoid wait times  [facility] [address], [address] [place], [place] [address] Modified hours of operation: Monday-Friday, 12 PM to 6 PM  Saturday & Sunday 10 AM to 4 PM *Across the street from [place] (New Address!) [address], [place], [place] [address] *Just off [address], across the road from [place] hours of operation: Monday-Friday, 12 PM to 6 PM  Closed Saturday & Sunday  [facility]'s modified hours of operation will be in effect from May 1 until May 31   The following sites will take your insurance:  . [facility] [place] a Provider at this Location  [address] [place], [place] [address] . 10 am to 8 pm  Monday-Friday . 12 pm to 8 pm Saturday-Sunday   . [facility] at [place] a Provider at this Location  [place], [place], [place] [address] . 8 am to 8 pm Monday-Friday . 9 am to 6 pm Saturday . 11 am to 6 pm Sunday   . [facility] at [place] Get Driving Directions  [address] Suite [place], [place] [address] . 8 am to 8 pm Monday-Friday . 8 am to 4 pm Saturday-Sunday   Your e-visit answers were reviewed by a board certified advanced clinical practitioner to complete your personal care plan.  Thank you for using e-Visits.

## 2018-10-13 MED FILL — MONTELUKAST SOD 10 MG TAB: 10 | 30 days supply | Qty: 30 | Fill #2

## 2018-11-10 MED FILL — SERTRALINE HCL 50 MG TABLET: 50 | 90 days supply | Qty: 90 | Fill #2

## 2018-11-12 MED FILL — MONTELUKAST SOD 10 MG TAB: 10 | 30 days supply | Qty: 30 | Fill #3

## 2018-12-13 ENCOUNTER — Other Ambulatory Visit: Payer: Self-pay | Admitting: Family Medicine

## 2018-12-13 DIAGNOSIS — J302 Other seasonal allergic rhinitis: Secondary | ICD-10-CM

## 2018-12-13 MED FILL — MONTELUKAST SOD 10 MG TAB: 10 | 30 days supply | Qty: 30 | Fill #0

## 2018-12-14 ENCOUNTER — Other Ambulatory Visit: Payer: Self-pay | Admitting: *Deleted

## 2018-12-14 DIAGNOSIS — J302 Other seasonal allergic rhinitis: Secondary | ICD-10-CM

## 2018-12-14 MED ORDER — MONTELUKAST SODIUM 10 MG PO TABS: 10.0000 mg | ORAL_TABLET | Freq: Every day | ORAL | 3 refills | Status: AC

## 2019-01-07 MED FILL — EPINEPHRINE 0.3 MG AUTO-INJ: 0.3 | 2 days supply | Qty: 2 | Fill #0

## 2019-01-31 MED FILL — SERTRALINE HCL 100 MG TAB: 100 | 90 days supply | Qty: 90 | Fill #0

## 2019-02-08 MED FILL — SERTRALINE HCL 50 MG TABLET: 50 | 90 days supply | Qty: 90 | Fill #3

## 2019-02-15 MED FILL — MONTELUKAST SOD 10 MG TAB: 10 | 30 days supply | Qty: 30 | Fill #0

## 2019-02-23 ENCOUNTER — Encounter: Payer: No Typology Code available for payment source | Admitting: Family Medicine

## 2019-03-21 ENCOUNTER — Ambulatory Visit (INDEPENDENT_AMBULATORY_CARE_PROVIDER_SITE_OTHER): Payer: No Typology Code available for payment source | Admitting: Family Medicine

## 2019-03-21 ENCOUNTER — Encounter: Payer: Self-pay | Admitting: Family Medicine

## 2019-03-21 ENCOUNTER — Other Ambulatory Visit: Payer: Self-pay

## 2019-03-21 VITALS — BP 110/70 | HR 82 | Temp 98.4°F | Resp 14 | Ht 63.0 in | Wt 145.0 lb

## 2019-03-21 DIAGNOSIS — Z0001 Encounter for general adult medical examination with abnormal findings: Secondary | ICD-10-CM

## 2019-03-21 DIAGNOSIS — D2262 Melanocytic nevi of left upper limb, including shoulder: Secondary | ICD-10-CM | POA: Diagnosis not present

## 2019-03-21 DIAGNOSIS — F419 Anxiety disorder, unspecified: Secondary | ICD-10-CM

## 2019-03-21 DIAGNOSIS — J45909 Unspecified asthma, uncomplicated: Secondary | ICD-10-CM

## 2019-03-21 DIAGNOSIS — Z0000 Encounter for general adult medical examination without abnormal findings: Secondary | ICD-10-CM

## 2019-03-21 MED ORDER — DIALYVITE VITAMIN D 5000 125 MCG (5000 UT) PO CAPS: 5000.0000 [IU] | ORAL_CAPSULE | Freq: Every day | ORAL | 0 refills | Status: DC

## 2019-03-21 MED ORDER — MIRENA (52 MG) 20 MCG/24HR IU IUD: 1.0000 | INTRAUTERINE_SYSTEM | Freq: Once | INTRAUTERINE | 0 refills | Status: AC

## 2019-03-21 MED FILL — MONTELUKAST SOD 10 MG TAB: 10 | 30 days supply | Qty: 30 | Fill #1

## 2019-03-21 NOTE — Progress Notes (Signed)
   Subjective:    Patient ID: [place] M Luppino, female    DOB: [birth year], 33 y.o.   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 Has history of ear infections, has never had tubes   On singulair, zyrtec, flonase    Needs referral to dermatology has moles, no known family history of skin cancer   GAD- started 4 years ago has been on zoloft 50mg  and has been stable, also gets seasonal affective disorder, she takes vitamin D during the winter and this helps    Dentist every 6 months   exercises some   Review Of Systems:  GEN- denies fatigue, fever, weight loss,weakness, recent illness HEENT- denies eye drainage, change in vision, nasal discharge, CVS- denies chest pain, palpitations RESP- denies SOB, cough, wheeze ABD- denies N/V, change in stools, abd pain GU- denies dysuria, hematuria, dribbling, incontinence MSK- denies joint pain, muscle aches, injury Neuro- denies headache, dizziness, syncope, seizure activity       Objective:    BP 110/70   Pulse 82   Temp 98.4 F (36.9 C) (Temporal)   Resp 14   Ht 5\' 3"  (1.6 m)   Wt 145 lb (65.8 kg)   LMP 03/16/2019 (Approximate) Comment: regular- IUD  SpO2 97%   BMI 25.69 kg/m  GEN- NAD, alert and oriented x3 HEENT- PERRL, EOMI, non injected sclera, pink conjunctiva, , TM clear bilat no effusion, Neck- Supple, no thyromegaly CVS- RRR, no murmur RESP-CTAB ABD-NABS,soft,NT,ND Psych- normal affect and mood  EXT- No edema Pulses- Radial, DP- 2+   Fall/depression/audit C screen negative      Assessment & Plan:      Problem List Items Addressed This Visit      Unprioritized   Anxiety    Continue zoloft, given by gyn      Asthma    Allergy induced, followed by allergist, has meds on  hand Allergy shots started recently        Other Visit Diagnoses    Routine general medical examination at a health care facility    -  Primary   CPE done, fasting labs, immunizations, UTD, f/u GYN for PAP Smears, referral to dermatology for mole check   Relevant Orders   CBC with Differential   Comprehensive metabolic panel   Lipid Panel   Multiple benign nevi of left upper extremity       Relevant Orders   Ambulatory referral to Dermatology      Note: This dictation was prepared with Dragon dictation along with smaller phrase technology. Any transcriptional errors that result from this process are unintentional.

## 2019-03-21 NOTE — Assessment & Plan Note (Signed)
Allergy induced, followed by allergist, has meds on hand Allergy shots started recently

## 2019-03-21 NOTE — Patient Instructions (Addendum)
F/U 1 year for physical  Referral to dermatology I recommend dental visit every 6 months Goal is to  Exercise 30 minutes 5 days a week

## 2019-03-21 NOTE — Assessment & Plan Note (Signed)
Continue zoloft, given by gyn

## 2019-03-22 LAB — CBC WITH DIFFERENTIAL/PLATELET
Absolute Monocytes: 368 cells/uL (ref 200–950)
Basophils Absolute: 51 cells/uL (ref 0–200)
Basophils Relative: 1.1 %
Eosinophils Absolute: 428 cells/uL (ref 15–500)
Eosinophils Relative: 9.3 %
HCT: 40.2 % (ref 35.0–45.0)
Hemoglobin: 13.3 g/dL (ref 11.7–15.5)
Lymphs Abs: 1996 cells/uL (ref 850–3900)
MCH: 28.6 pg (ref 27.0–33.0)
MCHC: 33.1 g/dL (ref 32.0–36.0)
MCV: 86.5 fL (ref 80.0–100.0)
MPV: 10.3 fL (ref 7.5–12.5)
Monocytes Relative: 8 %
Neutro Abs: 1757 cells/uL (ref 1500–7800)
Neutrophils Relative %: 38.2 %
Platelets: 265 10*3/uL (ref 140–400)
RBC: 4.65 10*6/uL (ref 3.80–5.10)
RDW: 14.5 % (ref 11.0–15.0)
Total Lymphocyte: 43.4 %
WBC: 4.6 10*3/uL (ref 3.8–10.8)

## 2019-03-22 LAB — COMPREHENSIVE METABOLIC PANEL
AG Ratio: 1.6 (calc) (ref 1.0–2.5)
ALT: 12 U/L (ref 6–29)
AST: 13 U/L (ref 10–30)
Albumin: 4.2 g/dL (ref 3.6–5.1)
Alkaline phosphatase (APISO): 28 U/L — ABNORMAL LOW (ref 31–125)
BUN: 13 mg/dL (ref 7–25)
CO2: 27 mmol/L (ref 20–32)
Calcium: 9.5 mg/dL (ref 8.6–10.2)
Chloride: 105 mmol/L (ref 98–110)
Creat: 0.76 mg/dL (ref 0.50–1.10)
Globulin: 2.6 g/dL (calc) (ref 1.9–3.7)
Glucose, Bld: 85 mg/dL (ref 65–99)
Potassium: 4 mmol/L (ref 3.5–5.3)
Sodium: 139 mmol/L (ref 135–146)
Total Bilirubin: 0.3 mg/dL (ref 0.2–1.2)
Total Protein: 6.8 g/dL (ref 6.1–8.1)

## 2019-03-22 LAB — LIPID PANEL
Cholesterol: 151 mg/dL (ref <200)
HDL: 53 mg/dL (ref >=50)
LDL Cholesterol (Calc): 84 mg/dL (calc)
Non-HDL Cholesterol (Calc): 98 mg/dL (calc) (ref <130)
Total CHOL/HDL Ratio: 2.8 (calc) (ref <5.0)
Triglycerides: 56 mg/dL (ref <150)

## 2019-04-18 MED FILL — MONTELUKAST SOD 10 MG TAB: 10 | 30 days supply | Qty: 30 | Fill #2

## 2019-05-03 MED FILL — SERTRALINE HCL 100 MG TAB: 100 | 90 days supply | Qty: 90 | Fill #1

## 2019-05-23 MED FILL — MONTELUKAST SOD 10 MG TAB: 10 | 30 days supply | Qty: 30 | Fill #3

## 2019-05-26 ENCOUNTER — Telehealth: Payer: No Typology Code available for payment source | Admitting: Physician Assistant

## 2019-05-26 DIAGNOSIS — J329 Chronic sinusitis, unspecified: Secondary | ICD-10-CM

## 2019-05-26 DIAGNOSIS — B9689 Other specified bacterial agents as the cause of diseases classified elsewhere: Secondary | ICD-10-CM | POA: Diagnosis not present

## 2019-05-26 MED ORDER — DOXYCYCLINE HYCLATE 100 MG PO TABS: 100.0000 mg | ORAL_TABLET | Freq: Two times a day (BID) | ORAL | 0 refills | Status: DC

## 2019-05-26 MED FILL — DOXYCYCLINE HYCLATE 100 MG: 100 | 10 days supply | Qty: 20 | Fill #0

## 2019-05-26 NOTE — Progress Notes (Signed)
We are sorry that you are not feeling well.  Here is how we plan to help!  Based on what you have shared with me it looks like you have sinusitis.  Sinusitis is inflammation and infection in the sinus cavities of the head.  Based on your presentation I believe you most likely have Acute Bacterial Sinusitis.  This is an infection caused by bacteria and is treated with antibiotics. I have prescribed Doxycycline 100mg  by mouth twice a day for 10 days. You may use an oral decongestant such as Mucinex D or if you have glaucoma or high blood pressure use plain Mucinex. Saline nasal spray help and can safely be used as often as needed for congestion.  If you develop worsening sinus pain, fever or notice severe headache and vision changes, or if symptoms are not better after completion of antibiotic, please schedule an appointment with a health care provider.    Sinus infections are not as easily transmitted as other respiratory infection, however we still recommend that you avoid close contact with loved ones, especially the very young and elderly.  Remember to wash your hands thoroughly throughout the day as this is the number one way to prevent the spread of infection!  Home Care:  Only take medications as instructed by your medical team.  Complete the entire course of an antibiotic.  Do not take these medications with alcohol.  A steam or ultrasonic humidifier can help congestion.  You can place a towel over your head and breathe in the steam from hot water coming from a faucet.  Avoid close contacts especially the very young and the elderly.  Cover your mouth when you cough or sneeze.  Always remember to wash your hands.  Get Help Right Away If:  You develop worsening fever or sinus pain.  You develop a severe head ache or visual changes.  Your symptoms persist after you have completed your treatment plan.  Make sure you  Understand these instructions.  Will watch your  condition.  Will get help right away if you are not doing well or get worse.  Your e-visit answers were reviewed by a board certified advanced clinical practitioner to complete your personal care plan.  Depending on the condition, your plan could have included both over the counter or prescription medications.  If there is a problem please reply  once you have received a response from your provider.  Your safety is important to [place].  If you have drug allergies check your prescription carefully.    You can use MyChart to ask questions about today's visit, request a non-urgent call back, or ask for a work or school excuse for 24 hours related to this e-Visit. If it has been greater than 24 hours you will need to follow up with your provider, or enter a new e-Visit to address those concerns.  You will get an e-mail in the next two days asking about your experience.  I hope that your e-visit has been valuable and will speed your recovery. Thank you for using e-visits.  Particia Nearing PA-C  Approximately 5 minutes was spent documenting and reviewing patient's chart.

## 2019-06-13 ENCOUNTER — Encounter: Payer: Self-pay | Admitting: Family Medicine

## 2019-06-17 MED FILL — MONTELUKAST SOD 10 MG TAB: 10 | 30 days supply | Qty: 30 | Fill #1

## 2019-07-04 ENCOUNTER — Encounter: Payer: Self-pay | Admitting: Family Medicine

## 2019-07-04 DIAGNOSIS — J329 Chronic sinusitis, unspecified: Secondary | ICD-10-CM

## 2019-07-04 DIAGNOSIS — J339 Nasal polyp, unspecified: Secondary | ICD-10-CM

## 2019-07-21 MED FILL — MONTELUKAST SOD 10 MG TAB: 10 | 30 days supply | Qty: 30 | Fill #2

## 2019-08-18 MED FILL — SERTRALINE HCL 100 MG TAB: 100 | 90 days supply | Qty: 90 | Fill #2

## 2019-08-18 MED FILL — MONTELUKAST SOD 10 MG TAB: 10 | 30 days supply | Qty: 30 | Fill #3

## 2019-12-21 MED FILL — MONTELUKAST SOD 10 MG TAB: 10 | 90 days supply | Qty: 90 | Fill #1

## 2019-12-30 ENCOUNTER — Ambulatory Visit: Payer: No Typology Code available for payment source | Admitting: Family Medicine

## 2019-12-30 ENCOUNTER — Encounter: Payer: Self-pay | Admitting: Family Medicine

## 2019-12-30 ENCOUNTER — Ambulatory Visit (INDEPENDENT_AMBULATORY_CARE_PROVIDER_SITE_OTHER): Payer: No Typology Code available for payment source | Admitting: Family Medicine

## 2019-12-30 ENCOUNTER — Other Ambulatory Visit: Payer: Self-pay

## 2019-12-30 VITALS — BP 118/70 | HR 65 | Temp 97.7°F | Resp 18 | Ht 63.0 in | Wt 151.0 lb

## 2019-12-30 DIAGNOSIS — Z0184 Encounter for antibody response examination: Secondary | ICD-10-CM

## 2019-12-30 DIAGNOSIS — Z0000 Encounter for general adult medical examination without abnormal findings: Secondary | ICD-10-CM | POA: Diagnosis not present

## 2019-12-30 DIAGNOSIS — Z111 Encounter for screening for respiratory tuberculosis: Secondary | ICD-10-CM | POA: Diagnosis not present

## 2019-12-30 DIAGNOSIS — Z1159 Encounter for screening for other viral diseases: Secondary | ICD-10-CM | POA: Diagnosis not present

## 2019-12-30 NOTE — Patient Instructions (Signed)
F/U 1 year for Physical  

## 2019-12-30 NOTE — Progress Notes (Signed)
   Subjective:    Patient ID: Alyssa Guerra, female    DOB: 1985-09-12, 34 y.o.   MRN: [id number]  Patient presents for Annual Exam Pt here for CPE Medications and history reviewed  Asthma/allergies she has meds on hand, no recent flares,   Dermatolgy, she is UTD, she is seen yearrly for mole check   GAD/Seasonal affective disorder- continues on zoloft   Dentist UTD  Gyn Dr. Melba Coon scheduled for for oct   Due for Hep C screening  Immunizations- will get Flu shot at work , COVID-19 UTD   Needs form for BSN program/ Vaccine titers   Regular physical activity- she coaches a local flag team  Review Of Systems:  GEN- denies fatigue, fever, weight loss,weakness, recent illness HEENT- denies eye drainage, change in vision, nasal discharge, CVS- denies chest pain, palpitations RESP- denies SOB, cough, wheeze ABD- denies N/V, change in stools, abd pain GU- denies dysuria, hematuria, dribbling, incontinence MSK- denies joint pain, muscle aches, injury Neuro- denies headache, dizziness, syncope, seizure activity       Objective:    BP 118/70 (BP Location: Right Arm, Patient Position: Sitting, Cuff Size: Normal)   Pulse 65   Temp 97.7 F (36.5 C) (Temporal)   Resp 18   Ht 5\' 3"  (1.6 m)   Wt 151 lb (68.5 kg)   SpO2 98%   BMI 26.75 kg/m  GEN- NAD, alert and oriented x3 HEENT- PERRL, EOMI, non injected sclera, pink conjunctiva, MMM, oropharynx clear Neck- Supple, no thyromegaly CVS- RRR, no murmur RESP-CTAB ABD-NABS,soft,NT,ND EXT- No edema Pulses- Radial, DP- 2+        Assessment & Plan:      Problem List Items Addressed This Visit    None    Visit Diagnoses    Routine general medical examination at a health care facility    -  Primary   CPE done, labs obained, lipids normal less than 1 year ago, titers done, Flu shot at work, form completed    Relevant Orders   CBC with Differential/Platelet (Completed)   Comprehensive metabolic panel (Completed)    Need for hepatitis C screening test       Relevant Orders   Hepatitis C antibody   Screening-pulmonary TB       Relevant Orders   QuantiFERON-TB Gold Plus   Immunity status testing       Relevant Orders   Varicella zoster antibody, IgG   Hepatitis B surface antibody,quantitative   Measles/Mumps/Rubella Immunity      Note: This dictation was prepared with Dragon dictation along with smaller Company secretary. Any transcriptional errors that result from this process are unintentional.

## 2020-01-01 ENCOUNTER — Encounter: Payer: Self-pay | Admitting: Family Medicine

## 2020-01-01 LAB — QUANTIFERON-TB GOLD PLUS
Mitogen-NIL: >10 IU/mL
NIL: 0.01 IU/mL
QuantiFERON-TB Gold Plus: NEGATIVE
TB1-NIL: 0.01 IU/mL
TB2-NIL: 0 IU/mL

## 2020-01-02 LAB — CBC WITH DIFFERENTIAL/PLATELET
Absolute Monocytes: 600 cells/uL (ref 200–950)
Basophils Absolute: 68 cells/uL (ref 0–200)
Basophils Relative: 0.9 %
Eosinophils Absolute: 443 cells/uL (ref 15–500)
Eosinophils Relative: 5.9 %
HCT: 38.2 % (ref 35.0–45.0)
Hemoglobin: 12.6 g/dL (ref 11.7–15.5)
Lymphs Abs: 2535 cells/uL (ref 850–3900)
MCH: 29.2 pg (ref 27.0–33.0)
MCHC: 33 g/dL (ref 32.0–36.0)
MCV: 88.4 fL (ref 80.0–100.0)
MPV: 10.3 fL (ref 7.5–12.5)
Monocytes Relative: 8 %
Neutro Abs: 3855 cells/uL (ref 1500–7800)
Neutrophils Relative %: 51.4 %
Platelets: 298 10*3/uL (ref 140–400)
RBC: 4.32 10*6/uL (ref 3.80–5.10)
RDW: 12.6 % (ref 11.0–15.0)
Total Lymphocyte: 33.8 %
WBC: 7.5 10*3/uL (ref 3.8–10.8)

## 2020-01-02 LAB — COMPREHENSIVE METABOLIC PANEL
AG Ratio: 1.8 (calc) (ref 1.0–2.5)
ALT: 16 U/L (ref 6–29)
AST: 16 U/L (ref 10–30)
Albumin: 4.4 g/dL (ref 3.6–5.1)
Alkaline phosphatase (APISO): 35 U/L (ref 31–125)
BUN: 13 mg/dL (ref 7–25)
CO2: 23 mmol/L (ref 20–32)
Calcium: 9.4 mg/dL (ref 8.6–10.2)
Chloride: 104 mmol/L (ref 98–110)
Creat: 0.7 mg/dL (ref 0.50–1.10)
Globulin: 2.4 g/dL (calc) (ref 1.9–3.7)
Glucose, Bld: 80 mg/dL (ref 65–99)
Potassium: 4.1 mmol/L (ref 3.5–5.3)
Sodium: 139 mmol/L (ref 135–146)
Total Bilirubin: 0.4 mg/dL (ref 0.2–1.2)
Total Protein: 6.8 g/dL (ref 6.1–8.1)

## 2020-01-02 LAB — MEASLES/MUMPS/RUBELLA IMMUNITY
Mumps IgG: <9 AU/mL — ABNORMAL LOW
Rubella: 1.68 Index
Rubeola IgG: 14.5 AU/mL — ABNORMAL LOW

## 2020-01-02 LAB — HEPATITIS C ANTIBODY
Hepatitis C Ab: NONREACTIVE
SIGNAL TO CUT-OFF: 0.01 (ref <1.00)

## 2020-01-02 LAB — HEPATITIS B SURFACE ANTIBODY, QUANTITATIVE: Hep B S AB Quant (Post): <5 m[IU]/mL — ABNORMAL LOW (ref >=10)

## 2020-01-02 LAB — VARICELLA ZOSTER ANTIBODY, IGG: Varicella IgG: 418.8 index

## 2020-01-04 ENCOUNTER — Encounter: Payer: Self-pay | Admitting: Family Medicine

## 2020-01-10 MED FILL — MONTELUKAST SOD 10 MG TAB: 10 | 90 days supply | Qty: 90 | Fill #1

## 2020-01-26 ENCOUNTER — Other Ambulatory Visit: Payer: Self-pay

## 2020-01-26 ENCOUNTER — Ambulatory Visit (INDEPENDENT_AMBULATORY_CARE_PROVIDER_SITE_OTHER): Payer: No Typology Code available for payment source | Admitting: *Deleted

## 2020-01-26 DIAGNOSIS — Z09 Encounter for follow-up examination after completed treatment for conditions other than malignant neoplasm: Secondary | ICD-10-CM

## 2020-01-26 DIAGNOSIS — Z23 Encounter for immunization: Secondary | ICD-10-CM | POA: Diagnosis not present

## 2020-02-03 ENCOUNTER — Other Ambulatory Visit (HOSPITAL_COMMUNITY): Payer: Self-pay | Admitting: Obstetrics and Gynecology

## 2020-02-03 MED FILL — SERTRALINE HCL 100 MG TABS: 100 | 90 days supply | Qty: 90 | Fill #0

## 2020-02-22 ENCOUNTER — Ambulatory Visit
Admission: RE | Admit: 2020-02-22 | Discharge: 2020-02-22 | Disposition: A | Discharge status: Home / Self Care | Payer: No Typology Code available for payment source | Source: Ambulatory Visit | Attending: Emergency Medicine | Admitting: Emergency Medicine

## 2020-02-22 ENCOUNTER — Other Ambulatory Visit: Payer: Self-pay | Admitting: Emergency Medicine

## 2020-02-22 ENCOUNTER — Other Ambulatory Visit: Payer: Self-pay

## 2020-02-22 VITALS — BP 111/68 | HR 99 | Temp 98.2°F | Resp 17 | Ht 63.0 in | Wt 145.0 lb

## 2020-02-22 DIAGNOSIS — R059 Cough, unspecified: Secondary | ICD-10-CM | POA: Diagnosis present

## 2020-02-22 DIAGNOSIS — J069 Acute upper respiratory infection, unspecified: Secondary | ICD-10-CM | POA: Diagnosis present

## 2020-02-22 LAB — POCT RAPID STREP A (OFFICE): Rapid Strep A Screen: NEGATIVE

## 2020-02-22 MED ORDER — BENZONATATE 100 MG PO CAPS: 100.0000 mg | ORAL_CAPSULE | Freq: Three times a day (TID) | ORAL | 0 refills | Status: DC

## 2020-02-22 MED ORDER — AMOXICILLIN-POT CLAVULANATE 875-125 MG PO TABS: 1.0000 | ORAL_TABLET | Freq: Two times a day (BID) | ORAL | 0 refills | Status: DC

## 2020-02-22 MED FILL — AMOX-CLAV 875-125 MG TABLET: 875-125 | 10 days supply | Qty: 20 | Fill #0

## 2020-02-22 MED FILL — BENZONATATE 100 MG CAPS: 100 | 7 days supply | Qty: 21 | Fill #0

## 2020-02-22 NOTE — ED Triage Notes (Addendum)
Sore throat that started Friday, since then has developed congestion, cough and LT ear pain.

## 2020-02-22 NOTE — Discharge Instructions (Addendum)
Strep negative.  Culture sent.   COVID testing ordered.  It will take between 5-7 days for test results.  Someone will contact you regarding abnormal results.    In the meantime: You should remain isolated in your home for 10 days from symptom onset AND greater than 72 hours after symptoms resolution (absence of fever without the use of fever-reducing medication and improvement in respiratory symptoms), whichever is longer Get plenty of rest and push fluids Tessalon Perles prescribed for cough Use OTC zyrtec for nasal congestion, runny nose, and/or sore throat Use OTC flonase for nasal congestion and runny nose Use medications daily for symptom relief Use OTC medications like ibuprofen or tylenol as needed fever or pain Call or go to the ED if you have any new or worsening symptoms such as fever, worsening cough, shortness of breath, chest tightness, chest pain, turning blue, changes in mental status, etc...   Augmentin for sinus infection and LT ear infection

## 2020-02-22 NOTE — ED Provider Notes (Signed)
[place]   [id number] 02/22/20 Arrival Time: 1033   CC: COVID symptoms  SUBJECTIVE: History from: patient.  Alyssa Guerra is a 34 y.o. female who presents with sore throat, sinus congestion, sinus pain/ pressure, cough and LT ear pain x 5 days.  Denies sick exposure to COVID, flu or strep.  Has tried OTC medications without relief.  Denies aggravating factors.  Denies previous symptoms in the past with COVID infection.   Denies fever, chills, SOB, wheezing, chest pain, nausea, changes in bowel or bladder habits.    ROS: As per HPI.  All other pertinent ROS negative.     Past Medical History:  Diagnosis Date  . Asthma   . Heartburn in pregnancy   . S/P cesarean section 06/23/2013  . S/P cesarean section 06/23/2013  . Seasonal allergies    Past Surgical History:  Procedure Laterality Date  . CESAREAN SECTION  02/15/2011   Procedure: CESAREAN SECTION;  Surgeon: Melina Schools, MD;  Location: [facility];  Service: Gynecology;  Laterality: N/A;  primary of baby boy  at 2054  APGAR 8/9  . CESAREAN SECTION N/A 06/23/2013   Procedure: REPEAT CESAREAN SECTION;  Surgeon: Thornell Sartorius, MD;  Location: [facility];  Service: Obstetrics;  Laterality: N/A;  1 1/2hrs OR time  . WISDOM TOOTH EXTRACTION  04/20/2017   Allergies  Allergen Reactions  . Other Other (See Comments)    Cat Dander   No current facility-administered medications on file prior to encounter.   Current Outpatient Medications on File Prior to Encounter  Medication Sig Dispense Refill  . albuterol (VENTOLIN HFA) 108 (90 Base) MCG/ACT inhaler Inhale 2 puffs into the lungs every 4 (four) hours as needed for wheezing or shortness of breath. 1 Inhaler 3  . cetirizine (ZYRTEC) 10 MG tablet Take 10 mg by mouth daily.      . Cholecalciferol (DIALYVITE VITAMIN D 5000) 125 MCG (5000 UT) capsule Take 1 capsule (5,000 Units total) by mouth daily. 30 capsule 0  . EPINEPHrine 0.3 mg/0.3 mL IJ SOAJ injection Inject 0.3 mLs (0.3 mg  total) into the muscle as needed for anaphylaxis. 1 Device 1  . fluticasone (FLONASE) 50 MCG/ACT nasal spray Place 2 sprays into both nostrils daily. 16 g 6  . levonorgestrel (MIRENA, 52 MG,) 20 MCG/24HR IUD 1 Intra Uterine Device (1 each total) by Intrauterine route once for 1 dose. 1 each 0  . montelukast (SINGULAIR) 10 MG tablet Take 1 tablet (10 mg total) by mouth at bedtime. 30 tablet 3  . sertraline (ZOLOFT) 50 MG tablet   5   Social History   Socioeconomic History  . Marital status: Married    Spouse name: Not on file  . Number of children: Not on file  . Years of education: Not on file  . Highest education level: Not on file  Occupational History  . Not on file  Tobacco Use  . Smoking status: Never Smoker  . Smokeless tobacco: Never Used  Substance and Sexual Activity  . Alcohol use: Yes    Alcohol/week: 2.0 standard drinks    Types: 1 Cans of beer, 1 Glasses of wine per week    Comment: none with pregnancy  . Drug use: No  . Sexual activity: Yes    Birth control/protection: None, I.U.D.    Comment: husband vasectomy  Other Topics Concern  . Not on file  Social History Narrative  . Not on file   Social Determinants of Health   Financial  Resource Strain:   . Difficulty of Paying Living Expenses: Not on file  Food Insecurity:   . Worried About Charity fundraiser in the Last Year: Not on file  . Ran Out of Food in the Last Year: Not on file  Transportation Needs:   . Lack of Transportation (Medical): Not on file  . Lack of Transportation (Non-Medical): Not on file  Physical Activity:   . Days of Exercise per Week: Not on file  . Minutes of Exercise per Session: Not on file  Stress:   . Feeling of Stress : Not on file  Social Connections:   . Frequency of Communication with Friends and Family: Not on file  . Frequency of Social Gatherings with Friends and Family: Not on file  . Attends Religious Services: Not on file  . Active Member of Clubs or Organizations:  Not on file  . Attends Archivist Meetings: Not on file  . Marital Status: Not on file  Intimate Partner Violence:   . Fear of Current or Ex-Partner: Not on file  . Emotionally Abused: Not on file  . Physically Abused: Not on file  . Sexually Abused: Not on file   Family History  Problem Relation Age of Onset  . CAD Mother   . Diabetes Maternal Grandmother   . Cancer - Ovarian Maternal Grandmother        ovarian? cervical?  . CVA Maternal Grandmother   . Diabetes Maternal Grandfather     OBJECTIVE:  Vitals:   02/22/20 1101 02/22/20 1102  BP: 111/68   Pulse: 99   Resp: 17   Temp: 98.2 F (36.8 C)   TempSrc: Oral   SpO2: 97%   Weight:  145 lb (65.8 kg)  Height:  5\' 3"  (1.6 m)    General appearance: alert; appears mildly fatigued, but nontoxic; speaking in full sentences and tolerating own secretions HEENT: NCAT; Ears: EACs clear, RT TM pearly gray, LT TM mildly erythematous and tense; Eyes: PERRL.  EOM grossly intact. Sinuses: TTP; Nose: nares patent without rhinorrhea, Throat: oropharynx clear, tonsils non erythematous or enlarged, uvula midline  Neck: supple without LAD Lungs: unlabored respirations, symmetrical air entry; cough: absent; no respiratory distress; CTAB Heart: regular rate and rhythm.   Skin: warm and dry Psychological: alert and cooperative; normal mood and affect  LABS:  Results for orders placed or performed during the hospital encounter of 02/22/20 (from the past 24 hour(s))  POCT rapid strep A     Status: None   Collection Time: 02/22/20 11:10 AM  Result Value Ref Range   Rapid Strep A Screen Negative Negative     ASSESSMENT & PLAN:  1. Cough   2. Viral URI with cough     Meds ordered this encounter  Medications  . benzonatate (TESSALON) 100 MG capsule    Sig: Take 1 capsule (100 mg total) by mouth every 8 (eight) hours.    Dispense:  21 capsule    Refill:  0    Order Specific Question:   Supervising Provider    Answer:    Raylene Everts [[id number]]  . amoxicillin-clavulanate (AUGMENTIN) 875-125 MG tablet    Sig: Take 1 tablet by mouth every 12 (twelve) hours for 10 days.    Dispense:  20 tablet    Refill:  0    Order Specific Question:   Supervising Provider    Answer:   Raylene Everts [[id number]]   Strep negative.  Culture sent.  COVID testing ordered.  It will take between 5-7 days for test results.  Someone will contact you regarding abnormal results.    In the meantime: You should remain isolated in your home for 10 days from symptom onset AND greater than 72 hours after symptoms resolution (absence of fever without the use of fever-reducing medication and improvement in respiratory symptoms), whichever is longer Get plenty of rest and push fluids Tessalon Perles prescribed for cough Use OTC zyrtec for nasal congestion, runny nose, and/or sore throat Use OTC flonase for nasal congestion and runny nose Use medications daily for symptom relief Use OTC medications like ibuprofen or tylenol as needed fever or pain Call or go to the ED if you have any new or worsening symptoms such as fever, worsening cough, shortness of breath, chest tightness, chest pain, turning blue, changes in mental status, etc...   Augmentin for sinus infection and LT ear infection  Reviewed expectations re: course of current medical issues. Questions answered. Outlined signs and symptoms indicating need for more acute intervention. Patient verbalized understanding. After Visit Summary given.         Lestine Box, PA-C 02/22/20 1121

## 2020-02-23 LAB — SARS-COV-2, NAA 2 DAY TAT

## 2020-02-23 LAB — NOVEL CORONAVIRUS, NAA: SARS-CoV-2, NAA: NOT DETECTED

## 2020-02-24 ENCOUNTER — Ambulatory Visit: Payer: No Typology Code available for payment source | Admitting: Family Medicine

## 2020-02-25 LAB — CULTURE, GROUP A STREP (THRC): Special Requests: NORMAL

## 2020-03-01 ENCOUNTER — Ambulatory Visit (INDEPENDENT_AMBULATORY_CARE_PROVIDER_SITE_OTHER): Payer: No Typology Code available for payment source | Admitting: *Deleted

## 2020-03-01 ENCOUNTER — Other Ambulatory Visit: Payer: Self-pay

## 2020-03-01 DIAGNOSIS — Z9229 Personal history of other drug therapy: Secondary | ICD-10-CM

## 2020-03-01 DIAGNOSIS — Z23 Encounter for immunization: Secondary | ICD-10-CM | POA: Diagnosis not present

## 2020-04-12 ENCOUNTER — Other Ambulatory Visit (HOSPITAL_COMMUNITY): Payer: Self-pay | Admitting: Allergy and Immunology

## 2020-04-12 MED FILL — MONTELUKAST SOD 10 MG TAB: 10 | 90 days supply | Qty: 90 | Fill #0

## 2020-04-24 ENCOUNTER — Encounter: Payer: Self-pay | Admitting: Family Medicine

## 2020-04-24 DIAGNOSIS — J339 Nasal polyp, unspecified: Secondary | ICD-10-CM

## 2020-04-24 DIAGNOSIS — J329 Chronic sinusitis, unspecified: Secondary | ICD-10-CM

## 2020-04-30 ENCOUNTER — Other Ambulatory Visit (HOSPITAL_COMMUNITY): Payer: Self-pay | Admitting: Otolaryngology

## 2020-04-30 DIAGNOSIS — J45909 Unspecified asthma, uncomplicated: Secondary | ICD-10-CM | POA: Diagnosis not present

## 2020-04-30 DIAGNOSIS — J309 Allergic rhinitis, unspecified: Secondary | ICD-10-CM | POA: Diagnosis not present

## 2020-04-30 DIAGNOSIS — J330 Polyp of nasal cavity: Secondary | ICD-10-CM | POA: Diagnosis not present

## 2020-04-30 DIAGNOSIS — J338 Other polyp of sinus: Secondary | ICD-10-CM | POA: Diagnosis not present

## 2020-04-30 MED FILL — predniSONE 20 MG TABS: 20 | 9 days supply | Qty: 18 | Fill #0

## 2020-05-01 ENCOUNTER — Other Ambulatory Visit: Payer: Self-pay | Admitting: Otolaryngology

## 2020-05-01 ENCOUNTER — Other Ambulatory Visit (HOSPITAL_COMMUNITY): Payer: Self-pay | Admitting: Otolaryngology

## 2020-05-01 DIAGNOSIS — J339 Nasal polyp, unspecified: Secondary | ICD-10-CM

## 2020-05-14 ENCOUNTER — Other Ambulatory Visit: Payer: Self-pay

## 2020-05-14 ENCOUNTER — Ambulatory Visit (HOSPITAL_COMMUNITY)
Admission: RE | Admit: 2020-05-14 | Discharge: 2020-05-14 | Disposition: A | Discharge status: Home / Self Care | Payer: 59 | Source: Ambulatory Visit | Attending: Otolaryngology | Admitting: Otolaryngology

## 2020-05-14 ENCOUNTER — Encounter (HOSPITAL_COMMUNITY): Payer: Self-pay

## 2020-05-14 DIAGNOSIS — J321 Chronic frontal sinusitis: Secondary | ICD-10-CM | POA: Diagnosis not present

## 2020-05-14 DIAGNOSIS — J3489 Other specified disorders of nose and nasal sinuses: Secondary | ICD-10-CM | POA: Diagnosis not present

## 2020-05-14 DIAGNOSIS — J339 Nasal polyp, unspecified: Secondary | ICD-10-CM | POA: Diagnosis not present

## 2020-05-14 DIAGNOSIS — J342 Deviated nasal septum: Secondary | ICD-10-CM | POA: Diagnosis not present

## 2020-05-14 DIAGNOSIS — J323 Chronic sphenoidal sinusitis: Secondary | ICD-10-CM | POA: Diagnosis not present

## 2020-05-17 ENCOUNTER — Other Ambulatory Visit (HOSPITAL_COMMUNITY): Payer: Self-pay | Admitting: Otolaryngology

## 2020-05-17 MED FILL — predniSONE 20 MG TABS: 20 | 9 days supply | Qty: 18 | Fill #0

## 2020-06-07 ENCOUNTER — Encounter: Payer: Self-pay | Admitting: Family Medicine

## 2020-06-29 DIAGNOSIS — H52223 Regular astigmatism, bilateral: Secondary | ICD-10-CM | POA: Diagnosis not present

## 2020-06-29 DIAGNOSIS — H5213 Myopia, bilateral: Secondary | ICD-10-CM | POA: Diagnosis not present

## 2020-07-03 DIAGNOSIS — J343 Hypertrophy of nasal turbinates: Secondary | ICD-10-CM | POA: Diagnosis not present

## 2020-07-03 DIAGNOSIS — J342 Deviated nasal septum: Secondary | ICD-10-CM | POA: Diagnosis not present

## 2020-07-03 DIAGNOSIS — J338 Other polyp of sinus: Secondary | ICD-10-CM | POA: Diagnosis not present

## 2020-07-03 DIAGNOSIS — J310 Chronic rhinitis: Secondary | ICD-10-CM | POA: Diagnosis not present

## 2020-07-05 ENCOUNTER — Other Ambulatory Visit: Payer: Self-pay | Admitting: Otolaryngology

## 2020-07-11 MED FILL — SERTRALINE HCL 100 MG TABS: 100 | 90 days supply | Qty: 90 | Fill #1

## 2020-07-11 MED FILL — MONTELUKAST SOD 10 MG TAB: 10 | 90 days supply | Qty: 90 | Fill #1

## 2020-07-12 ENCOUNTER — Other Ambulatory Visit (HOSPITAL_BASED_OUTPATIENT_CLINIC_OR_DEPARTMENT_OTHER): Payer: Self-pay

## 2020-07-19 ENCOUNTER — Other Ambulatory Visit: Payer: Self-pay

## 2020-07-19 ENCOUNTER — Encounter (HOSPITAL_BASED_OUTPATIENT_CLINIC_OR_DEPARTMENT_OTHER): Payer: Self-pay | Admitting: Otolaryngology

## 2020-07-24 ENCOUNTER — Other Ambulatory Visit (HOSPITAL_COMMUNITY): Payer: 59

## 2020-07-26 ENCOUNTER — Encounter (HOSPITAL_BASED_OUTPATIENT_CLINIC_OR_DEPARTMENT_OTHER): Payer: Self-pay | Admitting: Otolaryngology

## 2020-07-26 NOTE — Anesthesia Preprocedure Evaluation (Addendum)
Anesthesia Evaluation  Patient identified by MRN, date of birth, ID band Patient awake    Reviewed: Allergy & Precautions, NPO status , Patient's Chart, lab work & pertinent test results  Airway Mallampati: II  TM Distance: >3 FB Neck ROM: Full    Dental no notable dental hx. (+) Teeth Intact   Pulmonary asthma ,  Chronic sinusitis Deviated nasal septum   Pulmonary exam normal breath sounds clear to auscultation       Cardiovascular negative cardio ROS Normal cardiovascular exam Rhythm:Regular Rate:Normal     Neuro/Psych Anxiety negative neurological ROS     GI/Hepatic negative GI ROS, Neg liver ROS,   Endo/Other  negative endocrine ROS  Renal/GU negative Renal ROS  negative genitourinary   Musculoskeletal negative musculoskeletal ROS (+)   Abdominal   Peds  Hematology negative hematology ROS (+)   Anesthesia Other Findings   Reproductive/Obstetrics                            Anesthesia Physical Anesthesia Plan  ASA: II  Anesthesia Plan: General   Post-op Pain Management:    Induction: Intravenous  PONV Risk Score and Plan: Scopolamine patch - Pre-op, Treatment may vary due to age or medical condition, Midazolam, Ondansetron and Dexamethasone  Airway Management Planned: Oral ETT  Additional Equipment:   Intra-op Plan:   Post-operative Plan: Extubation in OR  Informed Consent: I have reviewed the patients History and Physical, chart, labs and discussed the procedure including the risks, benefits and alternatives for the proposed anesthesia with the patient or authorized representative who has indicated his/her understanding and acceptance.     Dental advisory given  Plan Discussed with: CRNA and Anesthesiologist  Anesthesia Plan Comments:        Anesthesia Quick Evaluation

## 2020-07-27 ENCOUNTER — Other Ambulatory Visit (HOSPITAL_COMMUNITY): Payer: Self-pay

## 2020-07-27 ENCOUNTER — Encounter (HOSPITAL_BASED_OUTPATIENT_CLINIC_OR_DEPARTMENT_OTHER): Payer: Self-pay | Admitting: Otolaryngology

## 2020-07-27 ENCOUNTER — Encounter (HOSPITAL_BASED_OUTPATIENT_CLINIC_OR_DEPARTMENT_OTHER)
Admission: RE | Disposition: A | Discharge status: Home / Self Care | Payer: Self-pay | Source: Home / Self Care | Attending: Otolaryngology

## 2020-07-27 ENCOUNTER — Ambulatory Visit (HOSPITAL_BASED_OUTPATIENT_CLINIC_OR_DEPARTMENT_OTHER): Payer: 59 | Admitting: Anesthesiology

## 2020-07-27 ENCOUNTER — Ambulatory Visit (HOSPITAL_BASED_OUTPATIENT_CLINIC_OR_DEPARTMENT_OTHER)
Admission: RE | Admit: 2020-07-27 | Discharge: 2020-07-27 | Disposition: A | Discharge status: Home / Self Care | Payer: 59 | Attending: Otolaryngology | Admitting: Otolaryngology

## 2020-07-27 ENCOUNTER — Other Ambulatory Visit: Payer: Self-pay

## 2020-07-27 DIAGNOSIS — J324 Chronic pansinusitis: Secondary | ICD-10-CM | POA: Insufficient documentation

## 2020-07-27 DIAGNOSIS — J339 Nasal polyp, unspecified: Secondary | ICD-10-CM | POA: Insufficient documentation

## 2020-07-27 DIAGNOSIS — J338 Other polyp of sinus: Secondary | ICD-10-CM | POA: Diagnosis not present

## 2020-07-27 DIAGNOSIS — J342 Deviated nasal septum: Secondary | ICD-10-CM | POA: Diagnosis not present

## 2020-07-27 DIAGNOSIS — J3489 Other specified disorders of nose and nasal sinuses: Secondary | ICD-10-CM | POA: Diagnosis not present

## 2020-07-27 DIAGNOSIS — J322 Chronic ethmoidal sinusitis: Secondary | ICD-10-CM | POA: Diagnosis not present

## 2020-07-27 DIAGNOSIS — J320 Chronic maxillary sinusitis: Secondary | ICD-10-CM | POA: Diagnosis not present

## 2020-07-27 DIAGNOSIS — J323 Chronic sphenoidal sinusitis: Secondary | ICD-10-CM | POA: Diagnosis not present

## 2020-07-27 DIAGNOSIS — J321 Chronic frontal sinusitis: Secondary | ICD-10-CM | POA: Diagnosis not present

## 2020-07-27 DIAGNOSIS — R430 Anosmia: Secondary | ICD-10-CM | POA: Diagnosis not present

## 2020-07-27 DIAGNOSIS — J329 Chronic sinusitis, unspecified: Secondary | ICD-10-CM | POA: Diagnosis not present

## 2020-07-27 HISTORY — PX: SEPTOPLASTY: SHX2393

## 2020-07-27 HISTORY — PX: MAXILLARY ANTROSTOMY: SHX2003

## 2020-07-27 HISTORY — PX: SPHENOIDECTOMY: SHX2421

## 2020-07-27 HISTORY — PX: SINUS EXPLORATION: SHX5214

## 2020-07-27 HISTORY — PX: ETHMOIDECTOMY: SHX5197

## 2020-07-27 HISTORY — PX: SINUS ENDO WITH FUSION: SHX5329

## 2020-07-27 LAB — POCT PREGNANCY, URINE: Preg Test, Ur: NEGATIVE

## 2020-07-27 SURGERY — SEPTOPLASTY, NOSE
Anesthesia: General | Site: Nose

## 2020-07-27 MED ORDER — OXYCODONE HCL 5 MG/5ML PO SOLN
5.0000 mg | Freq: Once | ORAL | Status: AC | PRN
Start: 2020-07-27 — End: 2020-07-27

## 2020-07-27 MED ORDER — FENTANYL CITRATE (PF) 100 MCG/2ML IJ SOLN
INTRAMUSCULAR | Status: DC | PRN
  Administered 2020-07-27 (×4): 50 ug via INTRAVENOUS

## 2020-07-27 MED ORDER — AMOXICILLIN 875 MG PO TABS
875.0000 mg | ORAL_TABLET | Freq: Two times a day (BID) | ORAL | 0 refills | Status: AC
  Filled 2020-07-27: qty 6, 3d supply, fill #0

## 2020-07-27 MED ORDER — SCOPOLAMINE 1 MG/3DAYS TD PT72
1.0000 | MEDICATED_PATCH | TRANSDERMAL | Status: DC
  Administered 2020-07-27: 1.5 mg via TRANSDERMAL

## 2020-07-27 MED ORDER — PROPOFOL 10 MG/ML IV BOLUS
INTRAVENOUS | Status: AC
  Filled 2020-07-27: qty 20

## 2020-07-27 MED ORDER — CEFAZOLIN SODIUM-DEXTROSE 2-4 GM/100ML-% IV SOLN
INTRAVENOUS | Status: AC
  Filled 2020-07-27: qty 100

## 2020-07-27 MED ORDER — FENTANYL CITRATE (PF) 100 MCG/2ML IJ SOLN
INTRAMUSCULAR | Status: AC
  Filled 2020-07-27: qty 2

## 2020-07-27 MED ORDER — MUPIROCIN 2 % EX OINT
TOPICAL_OINTMENT | CUTANEOUS | Status: AC
  Filled 2020-07-27: qty 22

## 2020-07-27 MED ORDER — ROCURONIUM BROMIDE 100 MG/10ML IV SOLN
INTRAVENOUS | Status: DC | PRN
  Administered 2020-07-27: 60 mg via INTRAVENOUS

## 2020-07-27 MED ORDER — OXYCODONE HCL 5 MG PO TABS
ORAL_TABLET | ORAL | Status: AC
  Filled 2020-07-27: qty 1

## 2020-07-27 MED ORDER — PROPOFOL 500 MG/50ML IV EMUL
INTRAVENOUS | Status: DC | PRN
  Administered 2020-07-27: 25 ug/kg/min via INTRAVENOUS

## 2020-07-27 MED ORDER — LIDOCAINE-EPINEPHRINE 1 %-1:100000 IJ SOLN
INTRAMUSCULAR | Status: AC
  Filled 2020-07-27: qty 1

## 2020-07-27 MED ORDER — LIDOCAINE HCL (CARDIAC) PF 100 MG/5ML IV SOSY
PREFILLED_SYRINGE | INTRAVENOUS | Status: DC | PRN
  Administered 2020-07-27: 60 mg via INTRAVENOUS

## 2020-07-27 MED ORDER — ONDANSETRON HCL 4 MG/2ML IJ SOLN
INTRAMUSCULAR | Status: DC | PRN
  Administered 2020-07-27: 4 mg via INTRAVENOUS

## 2020-07-27 MED ORDER — PROPOFOL 500 MG/50ML IV EMUL
INTRAVENOUS | Status: AC
  Filled 2020-07-27: qty 50

## 2020-07-27 MED ORDER — MIDAZOLAM HCL 2 MG/2ML IJ SOLN
INTRAMUSCULAR | Status: AC
  Filled 2020-07-27: qty 2

## 2020-07-27 MED ORDER — DEXAMETHASONE SODIUM PHOSPHATE 10 MG/ML IJ SOLN
INTRAMUSCULAR | Status: AC
  Filled 2020-07-27: qty 1

## 2020-07-27 MED ORDER — LACTATED RINGERS IV SOLN: INTRAVENOUS | Status: DC

## 2020-07-27 MED ORDER — SODIUM CHLORIDE 0.9 % IV SOLN
INTRAVENOUS | Status: AC | PRN
  Administered 2020-07-27: 200 mL

## 2020-07-27 MED ORDER — OXYMETAZOLINE HCL 0.05 % NA SOLN
NASAL | Status: AC
  Filled 2020-07-27: qty 30

## 2020-07-27 MED ORDER — MUPIROCIN 2 % EX OINT
TOPICAL_OINTMENT | CUTANEOUS | Status: DC | PRN
  Administered 2020-07-27: 1 via NASAL

## 2020-07-27 MED ORDER — CEFAZOLIN SODIUM-DEXTROSE 2-3 GM-%(50ML) IV SOLR
INTRAVENOUS | Status: DC | PRN
  Administered 2020-07-27: 2 g via INTRAVENOUS

## 2020-07-27 MED ORDER — DEXAMETHASONE SODIUM PHOSPHATE 4 MG/ML IJ SOLN
INTRAMUSCULAR | Status: DC | PRN
  Administered 2020-07-27: 10 mg via INTRAVENOUS

## 2020-07-27 MED ORDER — LIDOCAINE-EPINEPHRINE 1 %-1:100000 IJ SOLN
INTRAMUSCULAR | Status: DC | PRN
  Administered 2020-07-27: 2 mL

## 2020-07-27 MED ORDER — ONDANSETRON HCL 4 MG/2ML IJ SOLN: 4.0000 mg | Freq: Once | INTRAMUSCULAR | Status: DC | PRN

## 2020-07-27 MED ORDER — LIDOCAINE 2% (20 MG/ML) 5 ML SYRINGE
INTRAMUSCULAR | Status: AC
  Filled 2020-07-27: qty 5

## 2020-07-27 MED ORDER — SUGAMMADEX SODIUM 200 MG/2ML IV SOLN
INTRAVENOUS | Status: DC | PRN
  Administered 2020-07-27: 200 mg via INTRAVENOUS

## 2020-07-27 MED ORDER — EPHEDRINE SULFATE 50 MG/ML IJ SOLN
INTRAMUSCULAR | Status: DC | PRN
  Administered 2020-07-27: 10 mg via INTRAVENOUS

## 2020-07-27 MED ORDER — MIDAZOLAM HCL 5 MG/5ML IJ SOLN
INTRAMUSCULAR | Status: DC | PRN
  Administered 2020-07-27: 2 mg via INTRAVENOUS

## 2020-07-27 MED ORDER — PROPOFOL 10 MG/ML IV BOLUS
INTRAVENOUS | Status: DC | PRN
  Administered 2020-07-27: 160 mg via INTRAVENOUS

## 2020-07-27 MED ORDER — ROCURONIUM BROMIDE 10 MG/ML (PF) SYRINGE
PREFILLED_SYRINGE | INTRAVENOUS | Status: AC
  Filled 2020-07-27: qty 10

## 2020-07-27 MED ORDER — OXYMETAZOLINE HCL 0.05 % NA SOLN
NASAL | Status: DC | PRN
  Administered 2020-07-27: 1 via TOPICAL

## 2020-07-27 MED ORDER — OXYCODONE-ACETAMINOPHEN 5-325 MG PO TABS
1.0000 | ORAL_TABLET | ORAL | 0 refills | Status: AC | PRN
  Filled 2020-07-27: qty 12, 2d supply, fill #0

## 2020-07-27 MED ORDER — SCOPOLAMINE 1 MG/3DAYS TD PT72
MEDICATED_PATCH | TRANSDERMAL | Status: AC
  Filled 2020-07-27: qty 1

## 2020-07-27 MED ORDER — FENTANYL CITRATE (PF) 100 MCG/2ML IJ SOLN
25.0000 ug | INTRAMUSCULAR | Status: DC | PRN
  Administered 2020-07-27: 50 ug via INTRAVENOUS

## 2020-07-27 MED ORDER — OXYCODONE HCL 5 MG PO TABS
5.0000 mg | ORAL_TABLET | Freq: Once | ORAL | Status: AC | PRN
  Administered 2020-07-27: 5 mg via ORAL

## 2020-07-27 MED ORDER — ONDANSETRON HCL 4 MG/2ML IJ SOLN
INTRAMUSCULAR | Status: AC
  Filled 2020-07-27: qty 2

## 2020-07-27 SURGICAL SUPPLY — 54 items
ATTRACTOMAT 16X20 MAGNETIC DRP (DRAPES) IMPLANT
BLADE RAD40 ROTATE 4M 4 5PK (BLADE) IMPLANT
BLADE RAD60 ROTATE M4 4 5PK (BLADE) IMPLANT
BLADE ROTATE RAD 12 4 M4 (BLADE) IMPLANT
BLADE ROTATE RAD 40 4 M4 (BLADE) IMPLANT
BLADE ROTATE TRICUT 4X13 M4 (BLADE) ×3 IMPLANT
BLADE SURG 15 STRL LF DISP TIS (BLADE) IMPLANT
BLADE SURG 15 STRL SS (BLADE)
BLADE TRICUT ROTATE M4 4 5PK (BLADE) IMPLANT
BUR HS RAD FRONTAL 3 (BURR) IMPLANT
CANISTER SUC SOCK COL 7IN (MISCELLANEOUS) ×3 IMPLANT
CANISTER SUCT 1200ML W/VALVE (MISCELLANEOUS) ×6 IMPLANT
COAGULATOR SUCT 8FR VV (MISCELLANEOUS) ×3 IMPLANT
COVER WAND RF STERILE (DRAPES) IMPLANT
DECANTER SPIKE VIAL GLASS SM (MISCELLANEOUS) IMPLANT
DRSG NASAL KENNEDY LMNT 8CM (GAUZE/BANDAGES/DRESSINGS) IMPLANT
DRSG NASOPORE 8CM (GAUZE/BANDAGES/DRESSINGS) ×3 IMPLANT
DRSG TELFA 3X8 NADH (GAUZE/BANDAGES/DRESSINGS) IMPLANT
ELECT REM PT RETURN 9FT ADLT (ELECTROSURGICAL) ×3
ELECTRODE REM PT RTRN 9FT ADLT (ELECTROSURGICAL) ×2 IMPLANT
GLOVE SURG ENC MOIS LTX SZ6.5 (GLOVE) ×6 IMPLANT
GLOVE SURG ENC MOIS LTX SZ7.5 (GLOVE) ×3 IMPLANT
GLOVE SURG UNDER POLY LF SZ6.5 (GLOVE) ×3 IMPLANT
GLOVE SURG UNDER POLY LF SZ7 (GLOVE) ×6 IMPLANT
GOWN STRL REUS W/ TWL LRG LVL3 (GOWN DISPOSABLE) ×8 IMPLANT
GOWN STRL REUS W/TWL LRG LVL3 (GOWN DISPOSABLE) ×12
HEMOSTAT SURGICEL 2X14 (HEMOSTASIS) IMPLANT
IV NS 500ML (IV SOLUTION) ×3
IV NS 500ML BAXH (IV SOLUTION) ×2 IMPLANT
NEEDLE HYPO 25X1 1.5 SAFETY (NEEDLE) ×3 IMPLANT
NEEDLE SPNL 25GX3.5 QUINCKE BL (NEEDLE) IMPLANT
NS IRRIG 1000ML POUR BTL (IV SOLUTION) ×3 IMPLANT
PACK BASIN DAY SURGERY FS (CUSTOM PROCEDURE TRAY) ×3 IMPLANT
PACK ENT DAY SURGERY (CUSTOM PROCEDURE TRAY) ×3 IMPLANT
SLEEVE SCD COMPRESS KNEE MED (STOCKING) ×3 IMPLANT
SOLUTION BUTLER CLEAR DIP (MISCELLANEOUS) ×3 IMPLANT
SPLINT NASAL AIRWAY SILICONE (MISCELLANEOUS) ×3 IMPLANT
SPONGE GAUZE 2X2 8PLY STRL LF (GAUZE/BANDAGES/DRESSINGS) ×3 IMPLANT
SPONGE NEURO XRAY DETECT 1X3 (DISPOSABLE) ×3 IMPLANT
SUCTION FRAZIER HANDLE 10FR (MISCELLANEOUS)
SUCTION TUBE FRAZIER 10FR DISP (MISCELLANEOUS) IMPLANT
SUT CHROMIC 4 0 P 3 18 (SUTURE) ×3 IMPLANT
SUT PLAIN 4 0 ~~LOC~~ 1 (SUTURE) ×3 IMPLANT
SUT PROLENE 3 0 PS 2 (SUTURE) ×3 IMPLANT
SUT VIC AB 4-0 P-3 18XBRD (SUTURE) IMPLANT
SUT VIC AB 4-0 P3 18 (SUTURE)
SYR 50ML LL SCALE MARK (SYRINGE) ×3 IMPLANT
TOWEL GREEN STERILE FF (TOWEL DISPOSABLE) ×3 IMPLANT
TRACKER ENT INSTRUMENT (MISCELLANEOUS) ×3 IMPLANT
TRACKER ENT PATIENT (MISCELLANEOUS) ×3 IMPLANT
TUBE CONNECTING 20X1/4 (TUBING) ×3 IMPLANT
TUBE SALEM SUMP 16 FR W/ARV (TUBING) ×3 IMPLANT
TUBING STRAIGHTSHOT EPS 5PK (TUBING) ×3 IMPLANT
YANKAUER SUCT BULB TIP NO VENT (SUCTIONS) ×3 IMPLANT

## 2020-07-27 NOTE — H&P (Signed)
Cc: Nasal polyps, nasal obstruction, anosmia  HPI: The patient is a 35 y/o female who presents today for evaluation of nasal polyps. The patient has noted sinus issues for over 10 years. However, her symptoms have gotten worse in the past few years. She notes significant difficulty breathing through her nose with loss of her sense of smell. The patient was seeing Dr. Constance Holster. She was diagnosed with bilateral nasal polyps. The patient was treated with several courses of prednisone, steroid nasal sprays, and allergy medications, with only short term relief of her symptoms. She underwent a sinus CT which showed extensive polyps involving all of the sinuses. She was also noted to have a severe septal deviation. Surgical options were discussed but the patient was never able to get scheduled with Dr. Constance Holster.   The patient's review of systems (constitutional, eyes, ENT, cardiovascular, respiratory, GI, musculoskeletal, skin, neurologic, psychiatric, endocrine, hematologic, allergic) is noted in the ROS questionnaire.  It is reviewed with the patient.   Family health history: Diabetes, heart disease.  Major events: None.  Ongoing medical problems: Asthma, depression, anxiety, allergies.  Social history: The patient is married. She denies the use of tobacco or illegal drugs. She drinks alcohol once a month.   Exam: General: Communicates without difficulty, well nourished, no acute distress. Head: Normocephalic, no evidence injury, no tenderness, facial buttresses intact without stepoff. Eyes: PERRL, EOMI. No scleral icterus, conjunctivae clear. Neuro: CN II exam reveals vision grossly intact.  No nystagmus at any point of gaze. Ears: Auricles well formed without lesions.  Ear canals are intact without mass or lesion.  No erythema or edema is appreciated.  The TMs are intact without fluid. Nose: External evaluation reveals normal support and skin without lesions.  Dorsum is intact.  Anterior rhinoscopy reveals  congested and edematous mucosa over anterior aspect of the inferior turbinates and nasal septum.  No purulence is noted. Middle meatus is not well visualized. Oral:  Oral cavity and oropharynx are intact, symmetric, without erythema or edema.  Mucosa is moist without lesions. Neck: Full range of motion without pain.  There is no significant lymphadenopathy.  No masses palpable.  Thyroid bed within normal limits to palpation.  Parotid glands and submandibular glands equal bilaterally without mass.  Trachea is midline. Neuro:  CN 2-12 grossly intact. Gait normal. Vestibular: No nystagmus at any point of gaze.   Procedure: Flexible Nasal Endoscopy Description: Risks, benefits, and alternatives of flexible endoscopy were explained to the patient. Specific mention was made of the risk of throat numbness with difficulty swallowing, possible bleeding from the nose and mouth, and pain from the procedure. The patient gave oral consent to proceed.  The flexible scope was inserted into the right nasal cavity. Endoscopy of the interior nasal cavity, superior, inferior, and middle meatus was performed. The sphenoid-ethmoid recess was examined. Edematous mucosa was noted with large obstructing nasal polyps. No mass, or lesion was appreciated. Olfactory cleft was clear. Nasopharynx was clear. Turbinates were hypertrophied but without mass.  The procedure was repeated on the contralateral side with NSD. The patient tolerated the procedure well.   Assessment  1. Bilateral obstructing nasal polyps with large septal deviation. No purulent drainage or other suspicious mass or lesion is noted on today's nasal endoscopy. 2. Sinus CT shows involvement of all the sinuses.   Plan  1. The physical exam, nasal endoscopy, and CT findings are reviewed with the patient.  2. The patient would benefit from septoplasty and endoscopic sinus surgery to remove the  polyps. The risks, benefits, alternatives, and details of the procedure are  reviewed with the patient. Questions are invited and answered. 3. The patient is interested in proceeding with the procedures.  We will schedule the procedure in accordance with the family schedule.

## 2020-07-27 NOTE — Transfer of Care (Signed)
Immediate Anesthesia Transfer of Care Note  Patient: Alyssa Guerra  Procedure(s) Performed: SEPTOPLASTY (N/A Nose) MAXILLARY ANTROSTOMY AND TISSUE REMOVAL (Bilateral Nose) FRONTAL SINUS EXPLORATION (Bilateral Nose) ETHMOIDECTOMY (Bilateral Nose) SPHENOIDECTOMY WITH TISSUE REMOVAL (Bilateral Nose) SINUS ENDO WITH FUSION (Bilateral Nose)  Patient Location: PACU  Anesthesia Type:General  Level of Consciousness: sedated  Airway & Oxygen Therapy: Patient Spontanous Breathing and Patient connected to face mask oxygen  Post-op Assessment: Report given to RN and Post -op Vital signs reviewed and stable  Post vital signs: Reviewed and stable  Last Vitals:  Vitals Value Taken Time  BP 131/81 07/27/20 0942  Temp    Pulse 84 07/27/20 0943  Resp 12 07/27/20 0943  SpO2 100 % 07/27/20 0943  Vitals shown include unvalidated device data.  Last Pain:  Vitals:   07/27/20 0649  TempSrc: Oral  PainSc: 0-No pain         Complications: No complications documented.

## 2020-07-27 NOTE — Anesthesia Postprocedure Evaluation (Signed)
Anesthesia Post Note  Patient: Alyssa Guerra  Procedure(s) Performed: SEPTOPLASTY (N/A Nose) MAXILLARY ANTROSTOMY AND TISSUE REMOVAL (Bilateral Nose) FRONTAL SINUS EXPLORATION (Bilateral Nose) ETHMOIDECTOMY (Bilateral Nose) SPHENOIDECTOMY WITH TISSUE REMOVAL (Bilateral Nose) SINUS ENDO WITH FUSION (Bilateral Nose)     Patient location during evaluation: PACU Anesthesia Type: General Level of consciousness: awake and alert and oriented Pain management: pain level controlled Vital Signs Assessment: post-procedure vital signs reviewed and stable Respiratory status: spontaneous breathing, nonlabored ventilation and respiratory function stable Cardiovascular status: blood pressure returned to baseline and stable Postop Assessment: no apparent nausea or vomiting Anesthetic complications: no   No complications documented.  Last Vitals:  Vitals:   07/27/20 1015 07/27/20 1055  BP: 124/82 124/84  Pulse: 77 83  Resp: 19 18  Temp:  37 C  SpO2: 97% 97%    Last Pain:  Vitals:   07/27/20 1050  TempSrc:   PainSc: 4                  [name] A.

## 2020-07-27 NOTE — Discharge Instructions (Addendum)
Oxycodone given at 10:50. Next dose of percocet can be given at 2:50pm if needed.   POSTOPERATIVE INSTRUCTIONS FOR PATIENTS HAVING NASAL OR SINUS OPERATIONS ACTIVITY: Restrict activity at home for the first two days, resting as much as possible. Light activity is best. You may usually return to work within a week. You should refrain from nose blowing, strenuous activity, or heavy lifting greater than 20lbs for a total of one week after your operation.  If sneezing cannot be avoided, sneeze with your mouth open. DISCOMFORT: You may experience a dull headache and pressure along with nasal congestion and discharge. These symptoms may be worse during the first week after the operation but may last as long as two to four weeks.  Please take Tylenol or the pain medication that has been prescribed for you. Do not take aspirin or aspirin containing medications since they may cause bleeding.  You may experience symptoms of post nasal drainage, nasal congestion, headaches and fatigue for two or three months after your operation.  BLEEDING: You may have some blood tinged nasal drainage for approximately two weeks after the operation.  The discharge will be worse for the first week.  Please call our office at [phone] or go to the nearest hospital emergency room if you experience any of the following: heavy, bright red blood from your nose or mouth that lasts longer than 15 minutes or coughing up or vomiting bright red blood or blood clots. GENERAL CONSIDERATIONS: 1. A gauze dressing will be placed on your upper lip to absorb any drainage after the operation. You may need to change this several times a day.  If you do not have very much drainage, you may remove the dressing.  Remember that you may gently wipe your nose with a tissue and sniff in, but DO NOT blow your nose. 2. Please keep all of your postoperative appointments.  Your final results after the operation will depend on proper follow-up.  The initial  visit is usually 2 to 5 days after the operation.  During this visit, the remaining nasal packing and internal septal splints will be removed.  Your nasal and sinus cavities will be cleaned.  During the second visit, your nasal and sinus cavities will be cleaned again. Have someone drive you to your first two postoperative appointments.  3. How you care for your nose after the operation will influence the results that you obtain.  You should follow all directions, take your medication as prescribed, and call our office [phone] with any problems or questions. 4. You may be more comfortable sleeping with your head elevated on two pillows. 5. Do not take any medications that we have not prescribed or recommended. WARNING SIGNS: if any of the following should occur, please call our office: 1. Persistent fever greater than 102F. 2. Persistent vomiting. 3. Severe and constant pain that is not relieved by prescribed pain medication. 4. Trauma to the nose. 5. Rash or unusual side effects from any medicines.   Post Anesthesia Home Care Instructions  Activity: Get plenty of rest for the remainder of the day. A responsible individual must stay with you for 24 hours following the procedure.  For the next 24 hours, DO NOT: -Drive a car -Paediatric nurse -Drink alcoholic beverages -Take any medication unless instructed by your physician -Make any legal decisions or sign important papers.  Meals: Start with liquid foods such as gelatin or soup. Progress to regular foods as tolerated. Avoid greasy, spicy, heavy foods. If nausea and/or  vomiting occur, drink only clear liquids until the nausea and/or vomiting subsides. Call your physician if vomiting continues.  Special Instructions/Symptoms: Your throat may feel dry or sore from the anesthesia or the breathing tube placed in your throat during surgery. If this causes discomfort, gargle with warm salt water. The discomfort should disappear within 24  hours.  If you had a scopolamine patch placed behind your ear for the management of post- operative nausea and/or vomiting:  1. The medication in the patch is effective for 72 hours, after which it should be removed.  Wrap patch in a tissue and discard in the trash. Wash hands thoroughly with soap and water. 2. You may remove the patch earlier than 72 hours if you experience unpleasant side effects which may include dry mouth, dizziness or visual disturbances. 3. Avoid touching the patch. Wash your hands with soap and water after contact with the patch.

## 2020-07-27 NOTE — Op Note (Signed)
DATE OF PROCEDURE: 07/27/2020  OPERATIVE REPORT   SURGEON: Leta Baptist, MD   PREOPERATIVE DIAGNOSES:  1. Severe nasal septal deviation.  2. Bilateral chronic pansinusitis and polyposis. 3. Chronic nasal obstruction. 4. Anosmia.  POSTOPERATIVE DIAGNOSES:  1. Severe nasal septal deviation.  2. Bilateral chronic pansinusitis and polyposis. 3. Chronic nasal obstruction. 4. Anosmia.  PROCEDURE PERFORMED:  1. Septoplasty.  2. Bilateral endoscopic frontal sinusotomy and polyp removal. 3. Bilateral endoscopic total ethmoidectomy and sphenoidotomy with polyp removal. 4. Bilateral endoscopic maxillary antrostomy with polyp removal. 5. FUSION stereotactic image guidance  ANESTHESIA: General endotracheal tube anesthesia.   COMPLICATIONS: None.   ESTIMATED BLOOD LOSS: 200 mL  INDICATION FOR PROCEDURE: Alyssa Guerra is a 35 y.o. female with a history of chronic nasal obstruction, sinonasal polyps, and anosmia. The patient has noted sinus issues for over 10 years. However, her symptoms have gotten worse in the past few years. She notes significant difficulty breathing through her nose with loss of her sense of smell. The patient was seeing Dr. Constance Holster. She was diagnosed with bilateral nasal polyps. The patient was treated with several courses of prednisone, steroid nasal sprays, and allergy medications, with only short term relief of her symptoms. She underwent a sinus CT which showed extensive polyps involving all of the sinuses. She was also noted to have a severe septal deviation. Based on the above findings, the decision was made for the patient to undergo the above-stated procedures. The risks, benefits, alternatives, and details of the procedures were discussed with the patient. Questions were invited and answered. Informed consent was obtained.   DESCRIPTION OF PROCEDURE: The patient was taken to the operating room and placed supine on the operating table. General endotracheal tube anesthesia  was administered by the anesthesiologist. The patient was positioned, and prepped and draped in the standard fashion for nasal surgery. Pledgets soaked with Afrin were placed in both nasal cavities for decongestion. The pledgets were subsequently removed. The FUSION stereotactic image guidance marker was placed. The image guidance system was functional throughout the case.  Examination of the nasal cavity revealed a severe nasal septal deviation. 1% lidocaine with 1:100,000 epinephrine was injected onto the nasal septum bilaterally. A hemitransfixion incision was made on the left side. The mucosal flap was carefully elevated on the left side. A cartilaginous incision was made 1 cm superior to the caudal margin of the nasal septum. Mucosal flap was also elevated on the right side in the similar fashion. It should be noted that due to the severe septal deviation, the deviated portion of the cartilaginous and bony septum had to be removed in piecemeal fashion. Once the deviated portions were removed, a straight midline septum was achieved. The septum was then quilted with 4-0 plain gut sutures. The hemitransfixion incision was closed with interrupted 4-0 chromic sutures.   Using a 0 endoscope, the left nasal cavity was examined. A large amount of polypoid tissue was noted. A large middle turbinate was also noted. Using Tru-Cut forceps, the inferior one third and medial one half of the middle turbiante was resected. Polypoid tissue was noted within the middle meatus. The polypoid tissue was removed using a combination of microdebrider and Blakesley forceps. The uncinate process was resected with a freer elevator. The maxillary antrum was entered and enlarged using a combination of backbiter and microdebrider. Mucopurulent drainage and polypoid tissue were removed from the left maxillary sinus.  Attention was then focused on the ethmoid sinuses. The bony partitions of the anterior and posterior ethmoid  cavities were  taken down. Polypoid tissue was noted and removed.  More polyps were noted to obstruct the left sphenoid opening. The polyps were removed. The sphenoid opening was entered and enlarged. More polyps were removed from within the sphenoid sinus. Attention was then focused on the frontal sinus. The frontal recess was identified and enlarged by removing the surrounding bony partitions. Polypoid tissue was removed from the frontal recess. All 4 paranasal sinuses were copiously irrigated with saline solution.  The same procedure was repeated on the right side without exception. More polyps were noted on the right side. All polyps were removed. Doyle splints were applied to the nasal septum.  The care of the patient was turned over to the anesthesiologist. The patient was awakened from anesthesia without difficulty. The patient was extubated and transferred to the recovery room in good condition.   OPERATIVE FINDINGS: Nasal septal deviation and bilateral chronic rhinosinusitis and polyposis.  SPECIMEN: Bilateral sinus contents.  FOLLOWUP CARE: The patient be discharged home once she is awake and alert. The patient will be placed on Percocet p.r.n. pain, and amoxicillin 875 mg p.o. b.i.d. for 3 days. The patient will follow up in my office in 3 days for splint removal.   Raechel Marcos Raynelle Bring, MD

## 2020-07-27 NOTE — Anesthesia Procedure Notes (Signed)
Procedure Name: Intubation Date/Time: 07/27/2020 7:36 AM Performed by: Maryella Shivers, CRNA Pre-anesthesia Checklist: Patient identified, Emergency Drugs available, Suction available and Patient being monitored Patient Re-evaluated:Patient Re-evaluated prior to induction Oxygen Delivery Method: Circle system utilized Preoxygenation: Pre-oxygenation with 100% oxygen Induction Type: IV induction Ventilation: Mask ventilation without difficulty Laryngoscope Size: Mac and 3 Grade View: Grade I Tube type: Oral Rae Tube size: 7.0 mm Number of attempts: 1 Airway Equipment and Method: Stylet and Oral airway Placement Confirmation: ETT inserted through vocal cords under direct vision,  positive ETCO2 and breath sounds checked- equal and bilateral Secured at: 20 cm Tube secured with: Tape Dental Injury: Teeth and Oropharynx as per pre-operative assessment

## 2020-07-30 DIAGNOSIS — J324 Chronic pansinusitis: Secondary | ICD-10-CM | POA: Diagnosis not present

## 2020-07-30 DIAGNOSIS — J338 Other polyp of sinus: Secondary | ICD-10-CM | POA: Diagnosis not present

## 2020-07-30 LAB — SURGICAL PATHOLOGY

## 2020-07-31 ENCOUNTER — Encounter (HOSPITAL_BASED_OUTPATIENT_CLINIC_OR_DEPARTMENT_OTHER): Payer: Self-pay | Admitting: Otolaryngology

## 2020-08-13 ENCOUNTER — Other Ambulatory Visit (HOSPITAL_COMMUNITY): Payer: Self-pay

## 2020-08-13 MED ORDER — AMOXICILLIN-POT CLAVULANATE 875-125 MG PO TABS
1.0000 | ORAL_TABLET | Freq: Two times a day (BID) | ORAL | 0 refills | Status: DC
  Filled 2020-08-13: qty 14, 7d supply, fill #0

## 2020-10-15 MED FILL — Montelukast Sodium Tab 10 MG (Base Equiv): ORAL | 90 days supply | Qty: 90 | Fill #0 | Status: CN

## 2020-10-15 MED FILL — Sertraline HCl Tab 100 MG: ORAL | 90 days supply | Qty: 90 | Fill #0 | Status: CN

## 2020-10-16 ENCOUNTER — Other Ambulatory Visit (HOSPITAL_COMMUNITY): Payer: Self-pay

## 2020-10-20 ENCOUNTER — Other Ambulatory Visit: Payer: Self-pay

## 2020-10-20 ENCOUNTER — Ambulatory Visit (INDEPENDENT_AMBULATORY_CARE_PROVIDER_SITE_OTHER): Payer: 59

## 2020-10-20 ENCOUNTER — Ambulatory Visit
Admission: RE | Admit: 2020-10-20 | Discharge: 2020-10-20 | Disposition: A | Discharge status: Home / Self Care | Payer: 59 | Source: Ambulatory Visit | Attending: Emergency Medicine | Admitting: Emergency Medicine

## 2020-10-20 VITALS — BP 118/69 | HR 74 | Temp 98.1°F | Resp 16

## 2020-10-20 DIAGNOSIS — M79671 Pain in right foot: Secondary | ICD-10-CM

## 2020-10-20 DIAGNOSIS — S99921A Unspecified injury of right foot, initial encounter: Secondary | ICD-10-CM

## 2020-10-20 NOTE — ED Triage Notes (Signed)
Patient presents to Urgent Care with complaints of burning sensation to right side of foot. She states she hit her pinky toe last Sunday and since has had pain on side of foot. Treating pain with ibuprofen.

## 2020-10-20 NOTE — Discharge Instructions (Signed)
X-rays negative for fracture or dislocation Continue conservative management of rest, ice, and gentle stretches Alternate ibuprofen and/or tylenol as needed Follow up with PCP if symptoms persist Return or go to the ER if you have any new or worsening symptoms (fever, chills, chest pain, redness, swelling, bruising, deformity, etc...)

## 2020-10-20 NOTE — ED Provider Notes (Signed)
[name]   [id number] 10/20/20 Arrival Time: 0948  IF:OYDXA PAIN  SUBJECTIVE: History from: patient. Alyssa Guerra is a 35 y.o. female complains of RT foot pain and injury x 1 week.  Stubbed toe/ foot on dumbbell.  Localizes the pain to the little toe.  Describes the pain as intermittent and burning/ cold in character.  Has tried OTC medications with temporary relief.  Symptoms are made worse with walking.  Denies fever, chills, erythema, ecchymosis, effusion, weakness.  ROS: As per HPI.  All other pertinent ROS negative.     Past Medical History:  Diagnosis Date   Asthma    Heartburn in pregnancy    S/P cesarean section 06/23/2013   S/P cesarean section 06/23/2013   Seasonal allergies    Past Surgical History:  Procedure Laterality Date   CESAREAN SECTION  02/15/2011   Procedure: CESAREAN SECTION;  Surgeon: Melina Schools, MD;  Location: [facility];  Service: Gynecology;  Laterality: N/A;  primary of baby boy  at 2054  APGAR 8/9   CESAREAN SECTION N/A 06/23/2013   Procedure: REPEAT CESAREAN SECTION;  Surgeon: Thornell Sartorius, MD;  Location: [facility];  Service: Obstetrics;  Laterality: N/A;  1 1/2hrs OR time   ETHMOIDECTOMY Bilateral 07/27/2020   Procedure: ETHMOIDECTOMY;  Surgeon: Leta Baptist, MD;  Location: [facility];  Service: ENT;  Laterality: Bilateral;   MAXILLARY ANTROSTOMY Bilateral 07/27/2020   Procedure: MAXILLARY ANTROSTOMY AND TISSUE REMOVAL;  Surgeon: Leta Baptist, MD;  Location: [facility];  Service: ENT;  Laterality: Bilateral;   SEPTOPLASTY N/A 07/27/2020   Procedure: SEPTOPLASTY;  Surgeon: Leta Baptist, MD;  Location: [facility];  Service: ENT;  Laterality: N/A;   SINUS ENDO WITH FUSION Bilateral 07/27/2020   Procedure: SINUS ENDO WITH FUSION;  Surgeon: Leta Baptist, MD;  Location: [facility];  Service: ENT;  Laterality: Bilateral;   SINUS EXPLORATION Bilateral 07/27/2020   Procedure: FRONTAL SINUS EXPLORATION;  Surgeon: Leta Baptist, MD;  Location: [facility];  Service: ENT;  Laterality: Bilateral;   SPHENOIDECTOMY Bilateral 07/27/2020   Procedure: SPHENOIDECTOMY WITH TISSUE REMOVAL;  Surgeon: Leta Baptist, MD;  Location: [facility];  Service: ENT;  Laterality: Bilateral;   WISDOM TOOTH EXTRACTION  04/20/2017   Allergies  Allergen Reactions   Other Other (See Comments)    Cat Dander   No current facility-administered medications on file prior to encounter.   Current Outpatient Medications on File Prior to Encounter  Medication Sig Dispense Refill   albuterol (VENTOLIN HFA) 108 (90 Base) MCG/ACT inhaler Inhale 2 puffs into the lungs every 4 (four) hours as needed for wheezing or shortness of breath. 1 Inhaler 3   cetirizine (ZYRTEC) 10 MG tablet Take 10 mg by mouth daily.     Cholecalciferol (DIALYVITE VITAMIN D 5000) 125 MCG (5000 UT) capsule Take 1 capsule (5,000 Units total) by mouth daily. 30 capsule 0   EPINEPHrine 0.3 mg/0.3 mL IJ SOAJ injection Inject 0.3 mLs (0.3 mg total) into the muscle as needed for anaphylaxis. 1 Device 1   fluticasone (FLONASE) 50 MCG/ACT nasal spray Place 2 sprays into both nostrils daily. 16 g 6   levonorgestrel (MIRENA, 52 MG,) 20 MCG/24HR IUD 1 Intra Uterine Device (1 each total) by Intrauterine route once for 1 dose. 1 each 0   montelukast (SINGULAIR) 10 MG tablet Take 1 tablet (10 mg total) by mouth at bedtime. 30 tablet 3   montelukast (SINGULAIR) 10 MG  tablet TAKE 1 TABLET BY MOUTH AT BEDTIME 90 tablet 5   sertraline (ZOLOFT) 100 MG tablet TAKE 1 TABLET BY MOUTH ONCE DAILY 90 tablet 5   sertraline (ZOLOFT) 50 MG tablet   5   Social History   Socioeconomic History   Marital status: Married    Spouse name: Not on file   Number of children: Not on file   Years of education: Not on file   Highest education level: Not on file  Occupational History   Not on file  Tobacco Use   Smoking status: Never   Smokeless tobacco: Never  Substance and Sexual  Activity   Alcohol use: Yes    Alcohol/week: 2.0 standard drinks    Types: 1 Cans of beer, 1 Glasses of wine per week    Comment: none with pregnancy   Drug use: No   Sexual activity: Yes    Birth control/protection: None, I.U.D.    Comment: husband vasectomy  Other Topics Concern   Not on file  Social History Narrative   Not on file   Social Determinants of Health   Financial Resource Strain: Not on file  Food Insecurity: Not on file  Transportation Needs: Not on file  Physical Activity: Not on file  Stress: Not on file  Social Connections: Not on file  Intimate Partner Violence: Not on file   Family History  Problem Relation Age of Onset   CAD Mother    Diabetes Maternal Grandmother    Cancer - Ovarian Maternal Grandmother        ovarian? cervical?   CVA Maternal Grandmother    Diabetes Maternal Grandfather     OBJECTIVE:  Vitals:   10/20/20 1006  BP: 118/69  Pulse: 74  Resp: 16  Temp: 98.1 F (36.7 C)  TempSrc: Oral  SpO2: 97%    General appearance: ALERT; in no acute distress.  Head: NCAT Lungs: Normal respiratory effort CV: Dorsalis pedis pulse 2+ Musculoskeletal: RT foot Inspection: Skin warm, dry, clear and intact without obvious erythema, effusion, or ecchymosis. Tattoo present Palpation: TTP over distal 5th MT Strength: deferred Skin: warm and dry Neurologic: Ambulates without difficulty; Sensation intact about the lower extremities Psychological: alert and cooperative; normal mood and affect  DIAGNOSTIC STUDIES:  DG Foot Complete Right  Result Date: 10/20/2020 CLINICAL DATA:  Hit foot on dumbbell 1 week ago. Right lateral foot pain. Initial encounter. EXAM: RIGHT FOOT COMPLETE - 3+ VIEW COMPARISON:  None. FINDINGS: There is no evidence of fracture or dislocation. There is no evidence of arthropathy or other focal bone abnormality. Soft tissues are unremarkable. IMPRESSION: Negative. Electronically Signed   By: Marlaine Hind M.D.   On: 10/20/2020  10:40     ASSESSMENT & PLAN:  1. Right foot pain   2. Injury of right foot, initial encounter     X-rays negative for fracture or dislocation Continue conservative management of rest, ice, and gentle stretches Alternate ibuprofen and/or tylenol as needed Follow up with PCP if symptoms persist Return or go to the ER if you have any new or worsening symptoms (fever, chills, chest pain, redness, swelling, bruising, deformity, etc...)    Reviewed expectations re: course of current medical issues. Questions answered. Outlined signs and symptoms indicating need for more acute intervention. Patient verbalized understanding. After Visit Summary given.     Lestine Box, PA-C 10/20/20 1051

## 2020-10-24 ENCOUNTER — Other Ambulatory Visit (HOSPITAL_COMMUNITY): Payer: Self-pay

## 2020-10-24 MED FILL — Sertraline HCl Tab 100 MG: ORAL | 90 days supply | Qty: 90 | Fill #0 | Status: AC

## 2020-10-24 MED FILL — Montelukast Sodium Tab 10 MG (Base Equiv): ORAL | 90 days supply | Qty: 90 | Fill #0 | Status: AC

## 2020-11-09 ENCOUNTER — Other Ambulatory Visit (HOSPITAL_COMMUNITY): Payer: Self-pay

## 2020-11-21 ENCOUNTER — Other Ambulatory Visit (HOSPITAL_COMMUNITY): Payer: Self-pay

## 2020-12-28 ENCOUNTER — Other Ambulatory Visit (HOSPITAL_COMMUNITY): Payer: Self-pay

## 2020-12-29 ENCOUNTER — Other Ambulatory Visit (HOSPITAL_COMMUNITY): Payer: Self-pay

## 2021-01-01 ENCOUNTER — Other Ambulatory Visit (HOSPITAL_COMMUNITY): Payer: Self-pay

## 2021-01-22 ENCOUNTER — Other Ambulatory Visit (HOSPITAL_COMMUNITY): Payer: Self-pay

## 2021-01-22 MED FILL — Sertraline HCl Tab 100 MG: ORAL | 90 days supply | Qty: 90 | Fill #1 | Status: CN

## 2021-01-22 MED FILL — Montelukast Sodium Tab 10 MG (Base Equiv): ORAL | 90 days supply | Qty: 90 | Fill #1 | Status: CN

## 2021-01-30 ENCOUNTER — Other Ambulatory Visit (HOSPITAL_COMMUNITY): Payer: Self-pay

## 2021-02-07 ENCOUNTER — Other Ambulatory Visit (HOSPITAL_COMMUNITY): Payer: Self-pay

## 2021-02-07 DIAGNOSIS — Z130 Encounter for screening for diseases of the blood and blood-forming organs and certain disorders involving the immune mechanism: Secondary | ICD-10-CM | POA: Diagnosis not present

## 2021-02-07 DIAGNOSIS — Z1389 Encounter for screening for other disorder: Secondary | ICD-10-CM | POA: Diagnosis not present

## 2021-02-07 DIAGNOSIS — Z6828 Body mass index (BMI) 28.0-28.9, adult: Secondary | ICD-10-CM | POA: Diagnosis not present

## 2021-02-07 DIAGNOSIS — Z7689 Persons encountering health services in other specified circumstances: Secondary | ICD-10-CM | POA: Diagnosis not present

## 2021-02-07 DIAGNOSIS — F329 Major depressive disorder, single episode, unspecified: Secondary | ICD-10-CM | POA: Diagnosis not present

## 2021-02-07 DIAGNOSIS — Z01419 Encounter for gynecological examination (general) (routine) without abnormal findings: Secondary | ICD-10-CM | POA: Diagnosis not present

## 2021-02-07 DIAGNOSIS — J45909 Unspecified asthma, uncomplicated: Secondary | ICD-10-CM | POA: Diagnosis not present

## 2021-02-07 DIAGNOSIS — Z8632 Personal history of gestational diabetes: Secondary | ICD-10-CM | POA: Diagnosis not present

## 2021-02-07 MED ORDER — MONTELUKAST SODIUM 10 MG PO TABS
10.0000 mg | ORAL_TABLET | Freq: Every day | ORAL | 5 refills | Status: DC
  Filled 2021-02-07: qty 90, 90d supply, fill #0
  Filled 2021-05-02: qty 90, 90d supply, fill #1
  Filled 2021-07-17: qty 90, 90d supply, fill #2
  Filled 2021-09-13 – 2021-10-04 (×3): qty 90, 90d supply, fill #3

## 2021-02-07 MED ORDER — SERTRALINE HCL 100 MG PO TABS
100.0000 mg | ORAL_TABLET | Freq: Every day | ORAL | 5 refills | Status: DC
  Filled 2021-02-07: qty 90, 90d supply, fill #0
  Filled 2021-05-02: qty 90, 90d supply, fill #1
  Filled 2021-07-17: qty 90, 90d supply, fill #2
  Filled 2021-09-13 – 2021-09-17 (×2): qty 90, 90d supply, fill #3

## 2021-03-06 DIAGNOSIS — Z6829 Body mass index (BMI) 29.0-29.9, adult: Secondary | ICD-10-CM | POA: Diagnosis not present

## 2021-03-06 DIAGNOSIS — Z713 Dietary counseling and surveillance: Secondary | ICD-10-CM | POA: Diagnosis not present

## 2021-03-08 DIAGNOSIS — Z131 Encounter for screening for diabetes mellitus: Secondary | ICD-10-CM | POA: Diagnosis not present

## 2021-03-08 DIAGNOSIS — Z13220 Encounter for screening for lipoid disorders: Secondary | ICD-10-CM | POA: Diagnosis not present

## 2021-03-08 DIAGNOSIS — Z0000 Encounter for general adult medical examination without abnormal findings: Secondary | ICD-10-CM | POA: Diagnosis not present

## 2021-03-08 DIAGNOSIS — Z1329 Encounter for screening for other suspected endocrine disorder: Secondary | ICD-10-CM | POA: Diagnosis not present

## 2021-03-11 ENCOUNTER — Other Ambulatory Visit (HOSPITAL_COMMUNITY): Payer: Self-pay

## 2021-03-11 MED ORDER — SAXENDA 18 MG/3ML ~~LOC~~ SOPN: PEN_INJECTOR | SUBCUTANEOUS | 0 refills | Status: DC

## 2021-03-12 ENCOUNTER — Other Ambulatory Visit (HOSPITAL_COMMUNITY): Payer: Self-pay

## 2021-03-12 MED ORDER — SAXENDA 18 MG/3ML ~~LOC~~ SOPN
PEN_INJECTOR | SUBCUTANEOUS | 0 refills | Status: DC
  Filled 2021-03-12 – 2021-03-18 (×3): qty 15, 30d supply, fill #0

## 2021-03-13 ENCOUNTER — Other Ambulatory Visit (HOSPITAL_COMMUNITY): Payer: Self-pay

## 2021-03-18 ENCOUNTER — Other Ambulatory Visit (HOSPITAL_COMMUNITY): Payer: Self-pay

## 2021-03-18 MED ORDER — INSULIN PEN NEEDLE 31G X 6 MM MISC
0 refills | Status: AC
  Filled 2021-03-18: qty 100, 90d supply, fill #0

## 2021-03-19 ENCOUNTER — Other Ambulatory Visit (HOSPITAL_COMMUNITY): Payer: Self-pay

## 2021-03-28 ENCOUNTER — Other Ambulatory Visit (HOSPITAL_COMMUNITY): Payer: Self-pay

## 2021-03-28 MED ORDER — INSULIN PEN NEEDLE 31G X 6 MM MISC
0 refills | Status: AC
  Filled 2021-03-28: qty 100, 90d supply, fill #0

## 2021-03-29 ENCOUNTER — Other Ambulatory Visit (HOSPITAL_COMMUNITY): Payer: Self-pay

## 2021-04-10 DIAGNOSIS — Z713 Dietary counseling and surveillance: Secondary | ICD-10-CM | POA: Diagnosis not present

## 2021-04-10 DIAGNOSIS — E7800 Pure hypercholesterolemia, unspecified: Secondary | ICD-10-CM | POA: Diagnosis not present

## 2021-04-10 DIAGNOSIS — Z6829 Body mass index (BMI) 29.0-29.9, adult: Secondary | ICD-10-CM | POA: Diagnosis not present

## 2021-04-11 ENCOUNTER — Other Ambulatory Visit (HOSPITAL_COMMUNITY): Payer: Self-pay

## 2021-04-11 MED ORDER — OZEMPIC (0.25 OR 0.5 MG/DOSE) 2 MG/1.5ML ~~LOC~~ SOPN
0.5000 mg | PEN_INJECTOR | SUBCUTANEOUS | 0 refills | Status: DC
  Filled 2021-04-11: qty 1.5, 28d supply, fill #0

## 2021-04-16 ENCOUNTER — Other Ambulatory Visit (HOSPITAL_COMMUNITY): Payer: Self-pay

## 2021-05-03 ENCOUNTER — Other Ambulatory Visit (HOSPITAL_COMMUNITY): Payer: Self-pay

## 2021-05-06 ENCOUNTER — Other Ambulatory Visit (HOSPITAL_COMMUNITY): Payer: Self-pay

## 2021-05-31 ENCOUNTER — Other Ambulatory Visit (HOSPITAL_COMMUNITY): Payer: Self-pay

## 2021-05-31 MED ORDER — WEGOVY 1 MG/0.5ML ~~LOC~~ SOAJ
1.0000 mg | SUBCUTANEOUS | 0 refills | Status: DC
  Filled 2021-05-31 – 2021-06-04 (×3): qty 2, 28d supply, fill #0

## 2021-06-03 ENCOUNTER — Other Ambulatory Visit (HOSPITAL_COMMUNITY): Payer: Self-pay

## 2021-06-04 ENCOUNTER — Other Ambulatory Visit (HOSPITAL_COMMUNITY): Payer: Self-pay

## 2021-06-05 ENCOUNTER — Other Ambulatory Visit (HOSPITAL_COMMUNITY): Payer: Self-pay

## 2021-06-27 ENCOUNTER — Other Ambulatory Visit (HOSPITAL_COMMUNITY): Payer: Self-pay

## 2021-06-27 MED ORDER — WEGOVY 1.7 MG/0.75ML ~~LOC~~ SOAJ
1.7000 mg | SUBCUTANEOUS | 0 refills | Status: DC
  Filled 2021-06-27: qty 3, 28d supply, fill #0

## 2021-07-14 IMAGING — CT CT MAXILLOFACIAL W/O CM
3 series · 14 of 47 positions shown, 16 images · non-contrast
Comparison: No pertinent prior exams available for comparison.

CLINICAL DATA: Nose polyp. Additional history provided by scanning
technologist: Patient reports nasal polyp seen on physical exam.

EXAM:
CT MAXILLOFACIAL WITHOUT CONTRAST
TECHNIQUE: Multidetector CT images of the paranasal sinuses were obtained using
the standard protocol without intravenous contrast.

[Series 3: max soft · axial · 0.40mm/px · z∈[+1326,+1484]mm · 8 of 93 slices shown, 10 images]
[im 7/93  brain]
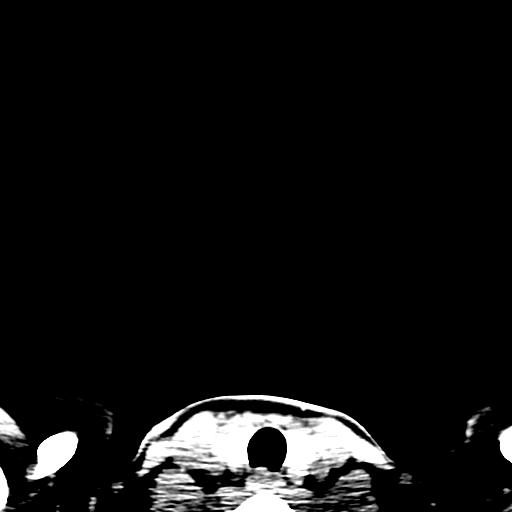
[im 7/93  bone]
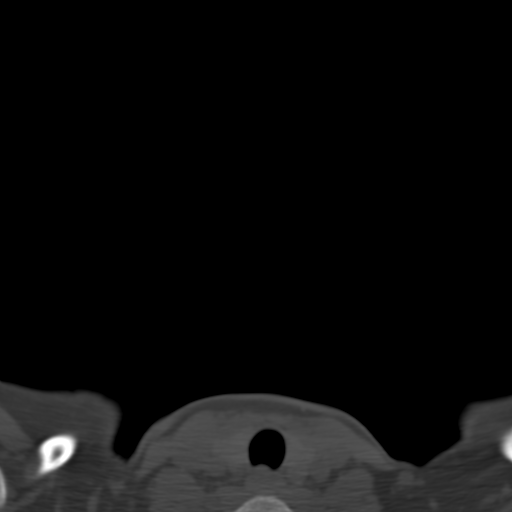
[im 20/93  bone]
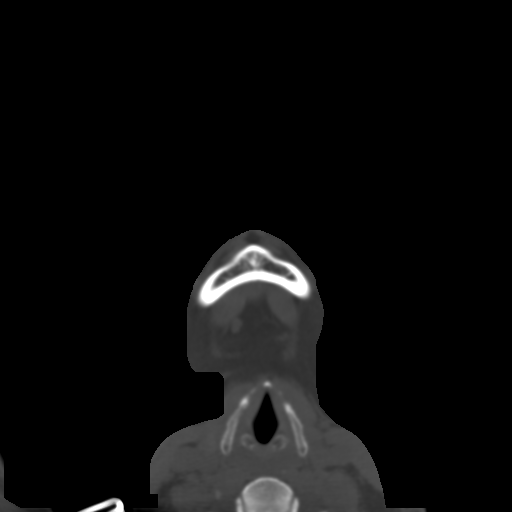
[im 29/93  bone]
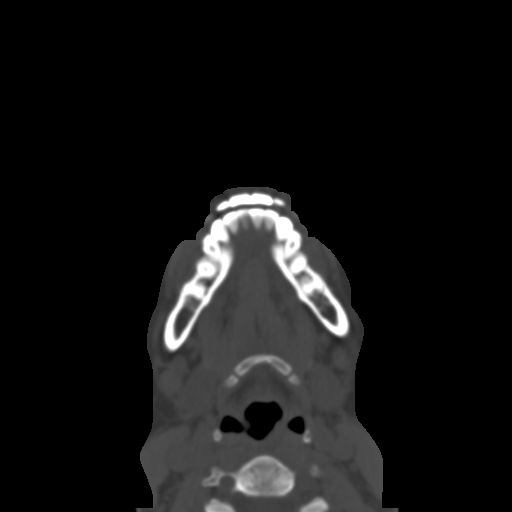
[im 42/93  bone]
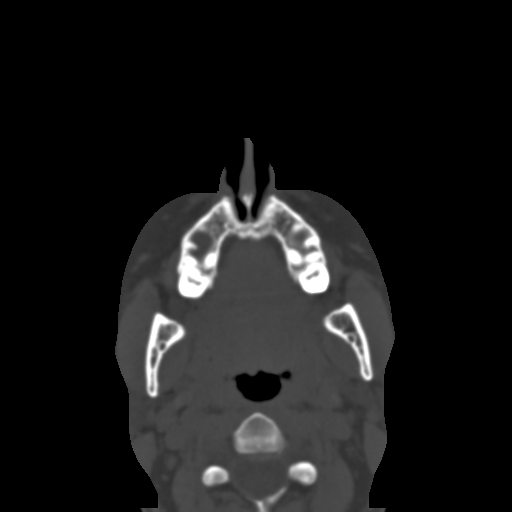
[im 51/93  brain]
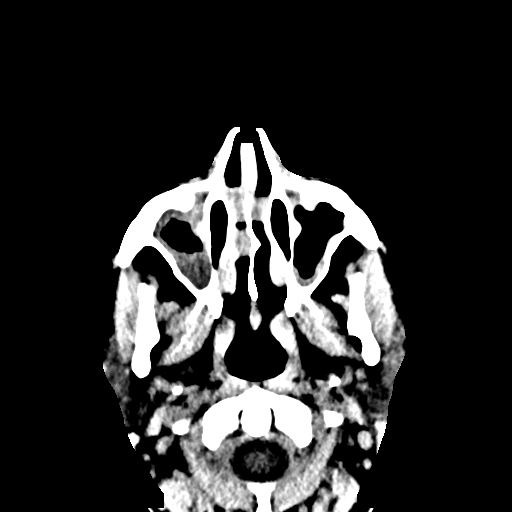
[im 51/93  bone]
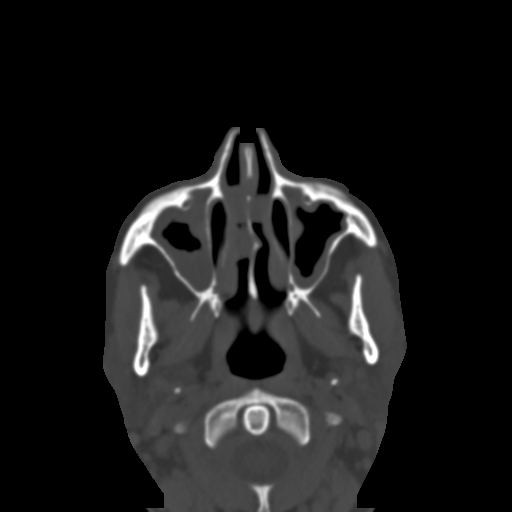
[im 64/93  bone]
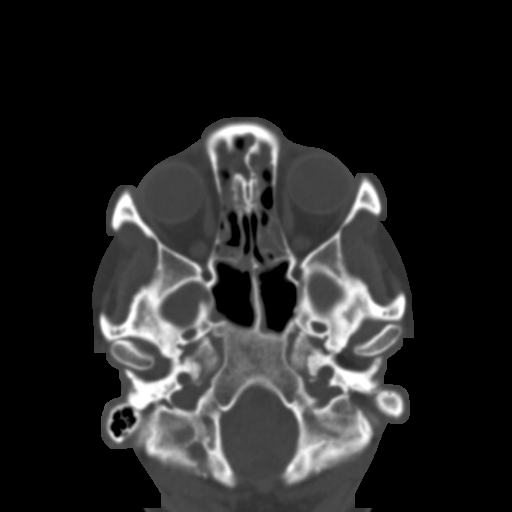
[im 73/93  bone]
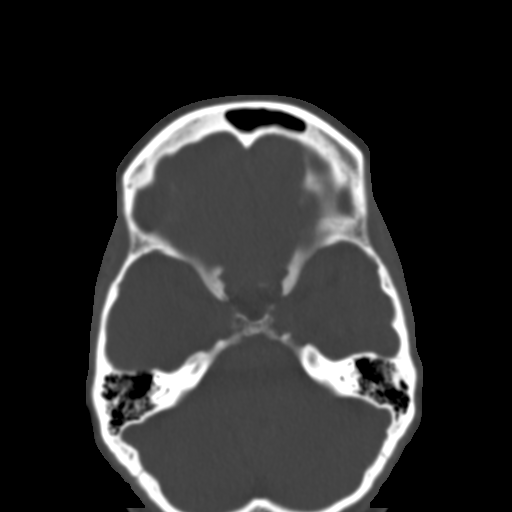
[im 86/93  bone]
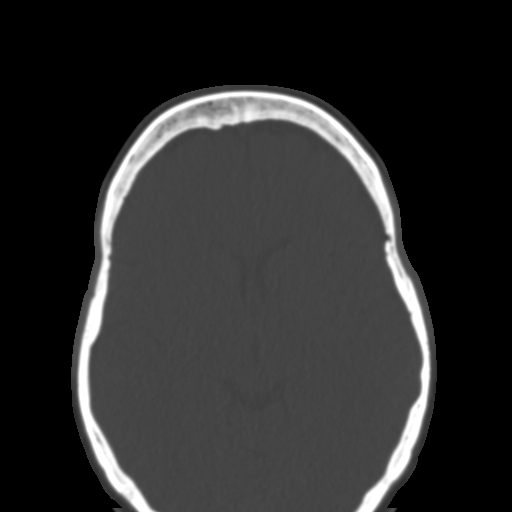

[Series 7: coronal soft · coronal · 0.35mm/px · 3 of 85 slices shown]
[im 29/85  bone]
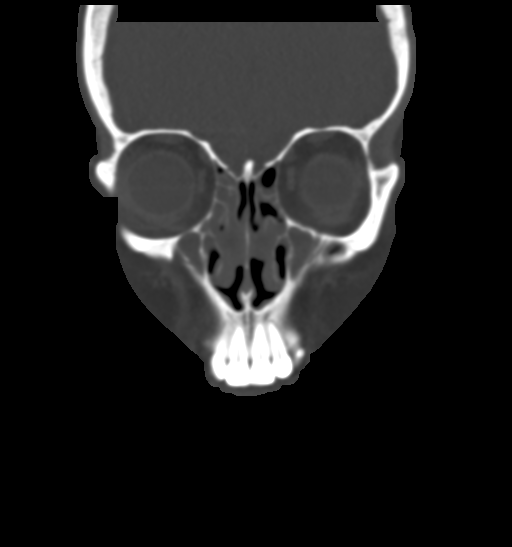
[im 38/85  bone]
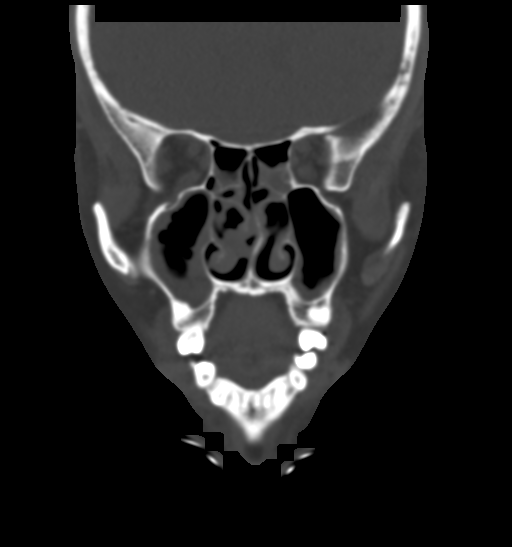
[im 47/85  bone]
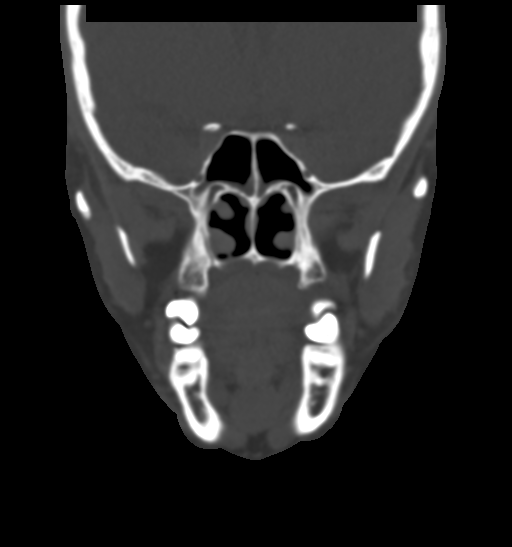

[Series 8: sagittal soft · sagittal · 0.33mm/px · 3 of 90 slices shown]
[im 30/90  bone]
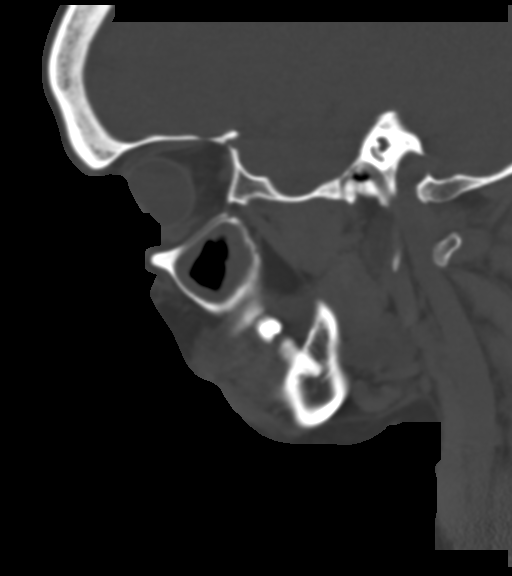
[im 45/90  bone]
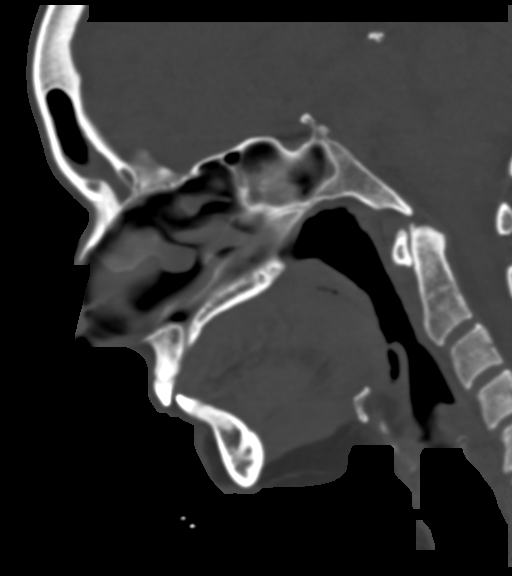
[im 60/90  bone]
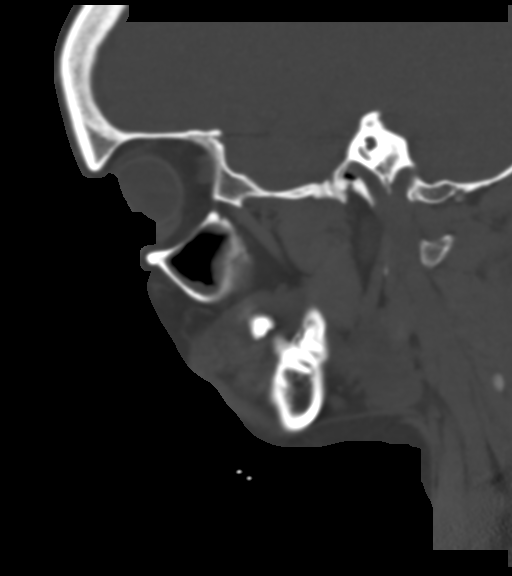

[14 of 47 positions shown; findings below may reference images not displayed]

FINDINGS: Paranasal sinuses:

Frontal: Frothy secretions and mild mucosal thickening within the
inferior right frontal sinus. Frothy secretions and moderate mucosal
thickening within the left frontal sinus, greatest inferiorly. The
frontal sinus drainage pathways are opacified.

Ethmoid: Extensive partial opacification of the bilateral ethmoid
air cells.

Maxillary: Frothy secretions and moderate/severe mucosal thickening
within the right maxillary sinus. Moderate mucosal thickening within
the left maxillary sinus.

Sphenoid: Mild mucosal thickening within both sphenoid sinuses.
Additionally, frothy secretions are present on the left. Mucosal
thickening obstructs the right greater than left sphenoid sinus
ostia. Mild partial opacification of the right sphenoethmoidal
recess. The left sphenoethmoidal recess is patent.

Right ostiomeatal unit: The maxillary sinus ostium, ethmoid
infundibulum, hiatus semilunaris and right middle meatus are
opacified.

Left ostiomeatal unit: Mucosal thickening narrows the maxillary
sinus ostium, ethmoid infundibulum and hiatus semilunaris. The left
middle meatus is opacified.

Nasal passages: Polypoid soft tissue partially obstructs the nasal
passages bilaterally. Within the right nasal cavity, polypoid soft
tissue measures 2.8 x 1.0 cm (AP x TV) (series 4, image 53). Within
the left nasal cavity, polypoid soft tissue measures 2.2 x 0.6 cm
(AP x TV) (series 4, image 53). Leftward deviation of the bony nasal
septum with leftward bony spurring.

Anatomy: No pneumatization superior to anterior ethmoid notches.
Symmetric and intact olfactory grooves and fovea ethmoidalis, Keros
II (4-7mm). Sellar sphenoid pneumatization pattern.
IMPRESSION: Polypoid soft tissue measuring 2.8 x 1.0 cm partially obstructs the
right nasal passage. Polypoid soft tissue measuring 2.2 x 0.6 cm
partially obstructs the left nasal passage. Findings are compatible
with the given history of nasal polyps.

Leftward deviation of the bony nasal septum with leftward bony
spurring.

Fairly extensive paranasal sinus disease as detailed and greatest
within the frontal, ethmoid, left sphenoid and right maxillary
sinuses. Correlate for acute on chronic sinusitis.

The bilateral frontal sinus drainage pathways, sphenoid sinus ostia,
right sphenoethmoidal recess and bilateral ostiomeatal units are
obstructed.

## 2021-07-15 ENCOUNTER — Other Ambulatory Visit (HOSPITAL_COMMUNITY): Payer: Self-pay

## 2021-07-17 ENCOUNTER — Other Ambulatory Visit (HOSPITAL_COMMUNITY): Payer: Self-pay

## 2021-07-17 MED ORDER — WEGOVY 2.4 MG/0.75ML ~~LOC~~ SOAJ
2.4000 mg | SUBCUTANEOUS | 2 refills | Status: DC
  Filled 2021-07-17: qty 3, 28d supply, fill #0
  Filled 2021-08-13: qty 3, 28d supply, fill #1
  Filled 2021-09-11 – 2021-09-17 (×3): qty 3, 28d supply, fill #2

## 2021-07-18 ENCOUNTER — Other Ambulatory Visit (HOSPITAL_COMMUNITY): Payer: Self-pay

## 2021-08-13 ENCOUNTER — Other Ambulatory Visit (HOSPITAL_COMMUNITY): Payer: Self-pay

## 2021-09-12 ENCOUNTER — Other Ambulatory Visit (HOSPITAL_COMMUNITY): Payer: Self-pay

## 2021-09-13 ENCOUNTER — Other Ambulatory Visit (HOSPITAL_COMMUNITY): Payer: Self-pay

## 2021-09-17 ENCOUNTER — Other Ambulatory Visit (HOSPITAL_COMMUNITY): Payer: Self-pay

## 2021-10-02 ENCOUNTER — Other Ambulatory Visit (HOSPITAL_COMMUNITY): Payer: Self-pay

## 2021-10-03 ENCOUNTER — Other Ambulatory Visit (HOSPITAL_COMMUNITY): Payer: Self-pay

## 2021-10-03 MED ORDER — SERTRALINE HCL 100 MG PO TABS
100.0000 mg | ORAL_TABLET | Freq: Every day | ORAL | 1 refills | Status: DC
  Filled 2021-10-03: qty 90, 90d supply, fill #0
  Filled 2022-04-26: qty 90, 90d supply, fill #1

## 2021-10-04 ENCOUNTER — Other Ambulatory Visit (HOSPITAL_COMMUNITY): Payer: Self-pay

## 2021-10-15 ENCOUNTER — Other Ambulatory Visit (HOSPITAL_COMMUNITY): Payer: Self-pay

## 2021-10-15 MED ORDER — WEGOVY 2.4 MG/0.75ML ~~LOC~~ SOAJ
2.4000 mg | SUBCUTANEOUS | 2 refills | Status: DC
  Filled 2021-10-15: qty 3, 28d supply, fill #0
  Filled 2021-11-06: qty 3, 28d supply, fill #1
  Filled 2021-12-22: qty 3, 28d supply, fill #2

## 2021-10-17 ENCOUNTER — Other Ambulatory Visit (HOSPITAL_COMMUNITY): Payer: Self-pay

## 2021-11-07 ENCOUNTER — Other Ambulatory Visit (HOSPITAL_COMMUNITY): Payer: Self-pay

## 2021-11-08 ENCOUNTER — Other Ambulatory Visit (HOSPITAL_COMMUNITY): Payer: Self-pay

## 2021-11-29 ENCOUNTER — Other Ambulatory Visit (HOSPITAL_COMMUNITY): Payer: Self-pay

## 2021-11-29 MED ORDER — WEGOVY 1.7 MG/0.75ML ~~LOC~~ SOAJ
1.7000 mg | SUBCUTANEOUS | 0 refills | Status: DC
  Filled 2021-11-29: qty 3, 28d supply, fill #0

## 2021-12-20 IMAGING — DX DG FOOT COMPLETE 3+V*R*
3 series · 3 of 3 positions shown · non-contrast
Comparison: None.

CLINICAL DATA: Hit foot on dumbbell 1 week ago. Right lateral foot
pain. Initial encounter.

EXAM:
RIGHT FOOT COMPLETE - 3+ VIEW

[foot ap]
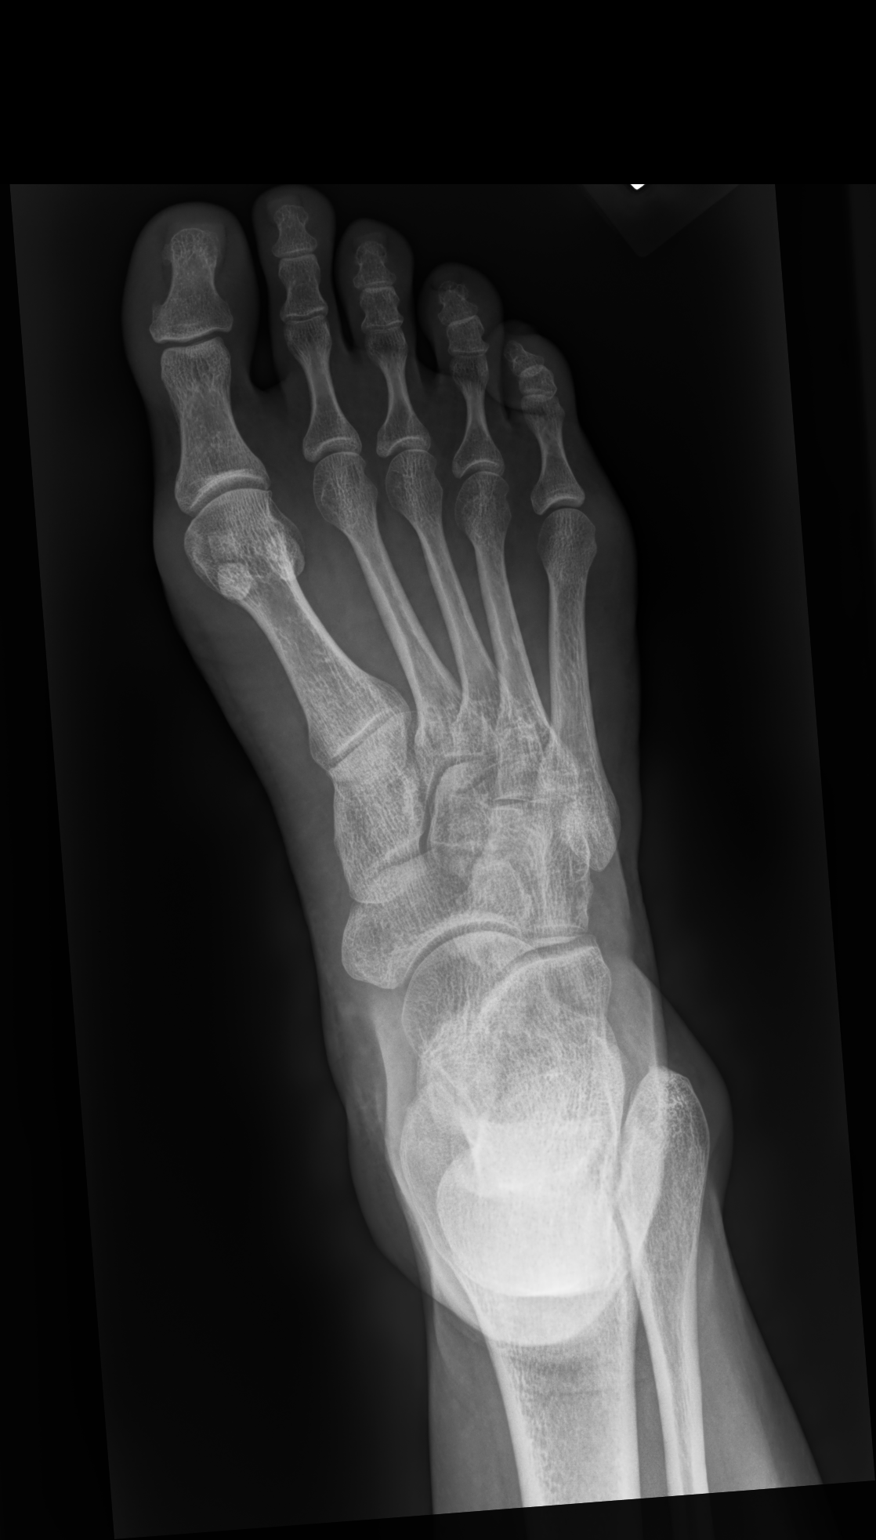

[foot mlo]
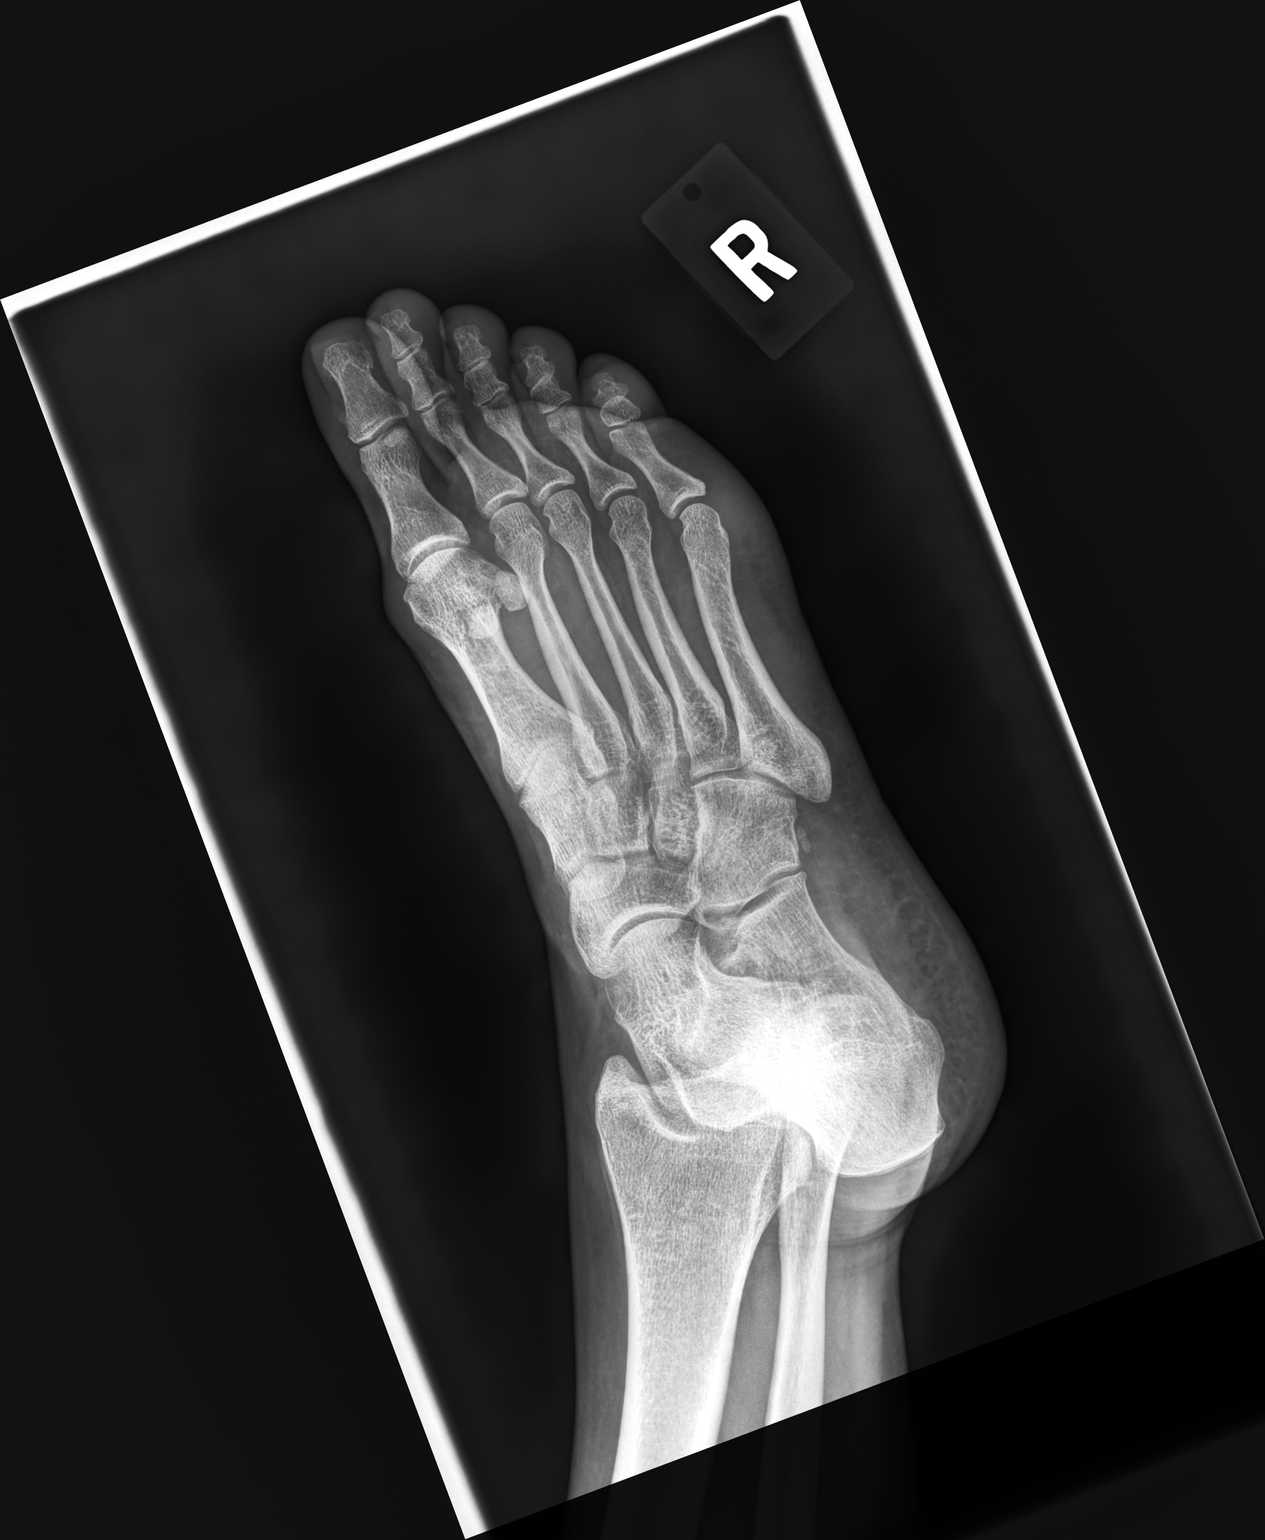

[foot lat]
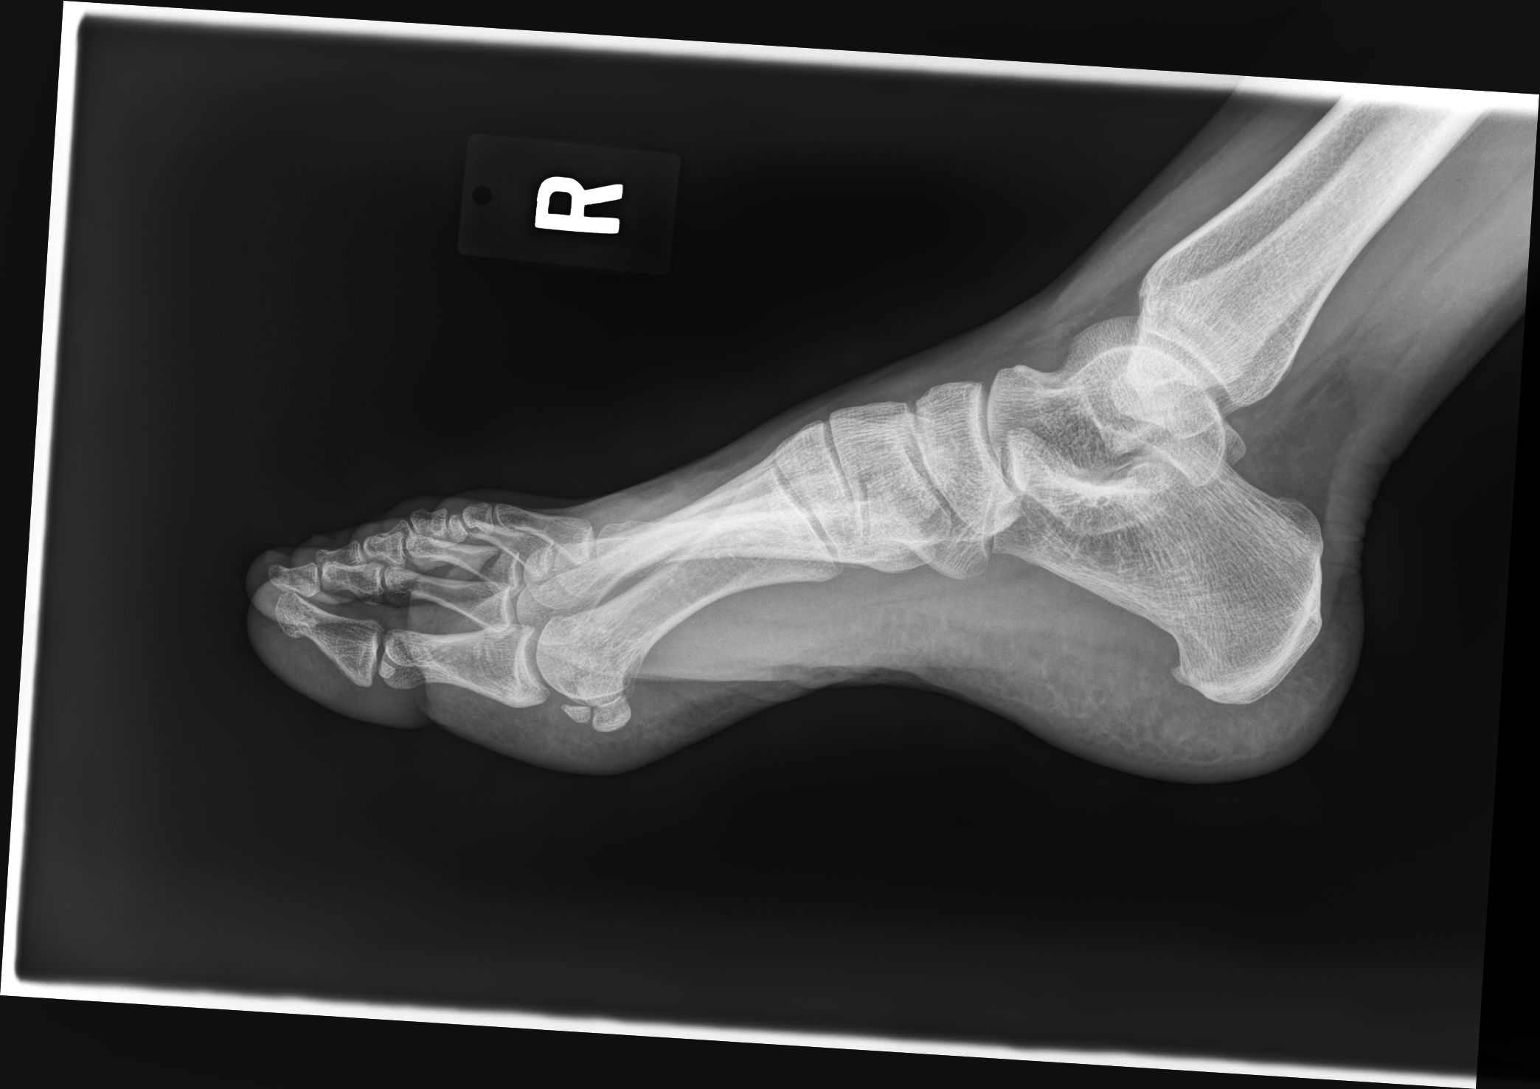

[3 of 3 positions shown; findings below may reference images not displayed]

FINDINGS: There is no evidence of fracture or dislocation. There is no
evidence of arthropathy or other focal bone abnormality. Soft
tissues are unremarkable.
IMPRESSION: Negative.

## 2021-12-22 ENCOUNTER — Other Ambulatory Visit (HOSPITAL_COMMUNITY): Payer: Self-pay

## 2021-12-24 ENCOUNTER — Other Ambulatory Visit (HOSPITAL_COMMUNITY): Payer: Self-pay

## 2021-12-24 MED ORDER — MONTELUKAST SODIUM 10 MG PO TABS
10.0000 mg | ORAL_TABLET | Freq: Every day | ORAL | 5 refills | Status: DC
  Filled 2021-12-24 – 2022-02-25 (×2): qty 90, 90d supply, fill #0
  Filled 2022-04-26 – 2022-04-29 (×2): qty 90, 90d supply, fill #1
  Filled 2022-08-25 – 2022-09-03 (×2): qty 90, 90d supply, fill #2
  Filled 2022-12-14 – 2022-12-16 (×3): qty 90, 90d supply, fill #3

## 2021-12-27 ENCOUNTER — Other Ambulatory Visit (HOSPITAL_COMMUNITY): Payer: Self-pay

## 2021-12-27 MED ORDER — SERTRALINE HCL 100 MG PO TABS
100.0000 mg | ORAL_TABLET | Freq: Every day | ORAL | 0 refills | Status: AC
  Filled 2021-12-27: qty 90, 90d supply, fill #0

## 2021-12-31 ENCOUNTER — Other Ambulatory Visit (HOSPITAL_COMMUNITY): Payer: Self-pay

## 2022-01-01 ENCOUNTER — Other Ambulatory Visit (HOSPITAL_COMMUNITY): Payer: Self-pay

## 2022-01-06 ENCOUNTER — Other Ambulatory Visit (HOSPITAL_COMMUNITY): Payer: Self-pay

## 2022-01-21 ENCOUNTER — Other Ambulatory Visit (HOSPITAL_COMMUNITY): Payer: Self-pay

## 2022-01-22 ENCOUNTER — Other Ambulatory Visit (HOSPITAL_COMMUNITY): Payer: Self-pay

## 2022-01-22 MED ORDER — WEGOVY 1.7 MG/0.75ML ~~LOC~~ SOAJ
1.7000 mg | SUBCUTANEOUS | 0 refills | Status: DC
  Filled 2022-01-22: qty 3, 28d supply, fill #0

## 2022-02-25 ENCOUNTER — Other Ambulatory Visit (HOSPITAL_COMMUNITY): Payer: Self-pay

## 2022-02-26 ENCOUNTER — Other Ambulatory Visit (HOSPITAL_COMMUNITY): Payer: Self-pay

## 2022-02-26 MED ORDER — WEGOVY 1.7 MG/0.75ML ~~LOC~~ SOAJ
1.7000 mg | SUBCUTANEOUS | 0 refills | Status: DC
  Filled 2022-02-26: qty 3, 28d supply, fill #0

## 2022-03-04 ENCOUNTER — Other Ambulatory Visit (HOSPITAL_COMMUNITY): Payer: Self-pay

## 2022-03-04 MED ORDER — SERTRALINE HCL 100 MG PO TABS
100.0000 mg | ORAL_TABLET | Freq: Every day | ORAL | 5 refills | Status: AC
  Filled 2022-03-04 – 2022-07-24 (×2): qty 90, 90d supply, fill #0
  Filled 2022-12-14 – 2022-12-16 (×3): qty 90, 90d supply, fill #1

## 2022-03-27 ENCOUNTER — Other Ambulatory Visit (HOSPITAL_COMMUNITY): Payer: Self-pay

## 2022-03-31 ENCOUNTER — Other Ambulatory Visit (HOSPITAL_COMMUNITY): Payer: Self-pay

## 2022-03-31 MED ORDER — WEGOVY 1.7 MG/0.75ML ~~LOC~~ SOAJ
1.7000 mg | SUBCUTANEOUS | 0 refills | Status: DC
  Filled 2022-03-31: qty 3, 28d supply, fill #0

## 2022-04-15 ENCOUNTER — Other Ambulatory Visit (HOSPITAL_COMMUNITY): Payer: Self-pay

## 2022-04-15 MED ORDER — WEGOVY 1 MG/0.5ML ~~LOC~~ SOAJ
1.0000 mg | SUBCUTANEOUS | 6 refills | Status: DC
  Filled 2022-04-15 – 2022-04-25 (×5): qty 2, 28d supply, fill #0
  Filled 2022-05-18: qty 2, 28d supply, fill #1
  Filled 2022-06-15: qty 2, 28d supply, fill #2
  Filled 2022-07-13: qty 2, 28d supply, fill #3
  Filled 2022-08-11: qty 2, 28d supply, fill #4
  Filled 2022-09-06: qty 2, 28d supply, fill #5

## 2022-04-23 ENCOUNTER — Other Ambulatory Visit (HOSPITAL_COMMUNITY): Payer: Self-pay

## 2022-04-23 ENCOUNTER — Other Ambulatory Visit (HOSPITAL_BASED_OUTPATIENT_CLINIC_OR_DEPARTMENT_OTHER): Payer: Self-pay

## 2022-04-23 ENCOUNTER — Encounter (HOSPITAL_BASED_OUTPATIENT_CLINIC_OR_DEPARTMENT_OTHER): Payer: Self-pay | Admitting: Pharmacist

## 2022-04-25 ENCOUNTER — Other Ambulatory Visit (HOSPITAL_BASED_OUTPATIENT_CLINIC_OR_DEPARTMENT_OTHER): Payer: Self-pay

## 2022-04-28 ENCOUNTER — Other Ambulatory Visit: Payer: Self-pay

## 2022-04-29 ENCOUNTER — Other Ambulatory Visit (HOSPITAL_COMMUNITY): Payer: Self-pay

## 2022-04-30 ENCOUNTER — Other Ambulatory Visit (HOSPITAL_COMMUNITY): Payer: Self-pay

## 2022-05-07 ENCOUNTER — Other Ambulatory Visit: Payer: Self-pay

## 2022-05-08 ENCOUNTER — Other Ambulatory Visit (HOSPITAL_COMMUNITY): Payer: Self-pay

## 2022-07-24 ENCOUNTER — Other Ambulatory Visit (HOSPITAL_COMMUNITY): Payer: Self-pay

## 2022-07-25 ENCOUNTER — Other Ambulatory Visit (HOSPITAL_COMMUNITY): Payer: Self-pay

## 2022-08-26 ENCOUNTER — Encounter: Payer: Self-pay | Admitting: Pharmacist

## 2022-08-26 ENCOUNTER — Other Ambulatory Visit: Payer: Self-pay

## 2022-08-26 DIAGNOSIS — Z30433 Encounter for removal and reinsertion of intrauterine contraceptive device: Secondary | ICD-10-CM | POA: Diagnosis not present

## 2022-08-29 ENCOUNTER — Other Ambulatory Visit: Payer: Self-pay

## 2022-09-03 ENCOUNTER — Other Ambulatory Visit (HOSPITAL_COMMUNITY): Payer: Self-pay

## 2022-09-03 DIAGNOSIS — S0500XA Injury of conjunctiva and corneal abrasion without foreign body, unspecified eye, initial encounter: Secondary | ICD-10-CM | POA: Diagnosis not present

## 2022-09-03 MED ORDER — POLYMYXIN B-TRIMETHOPRIM 10000-0.1 UNIT/ML-% OP SOLN
1.0000 [drp] | Freq: Three times a day (TID) | OPHTHALMIC | 1 refills | Status: DC
  Filled 2022-09-03: qty 10, 10d supply, fill #0

## 2022-09-12 ENCOUNTER — Other Ambulatory Visit (HOSPITAL_COMMUNITY): Payer: Self-pay

## 2022-10-20 ENCOUNTER — Other Ambulatory Visit (HOSPITAL_COMMUNITY): Payer: Self-pay

## 2022-10-20 MED ORDER — ZEPBOUND 5 MG/0.5ML ~~LOC~~ SOAJ
5.0000 mg | SUBCUTANEOUS | 0 refills | Status: DC
  Filled 2022-10-20 – 2022-10-24 (×2): qty 2, 28d supply, fill #0

## 2022-10-22 ENCOUNTER — Other Ambulatory Visit (HOSPITAL_COMMUNITY): Payer: Self-pay

## 2022-10-24 ENCOUNTER — Other Ambulatory Visit (HOSPITAL_COMMUNITY): Payer: Self-pay

## 2022-10-31 ENCOUNTER — Other Ambulatory Visit: Payer: Self-pay | Admitting: Oncology

## 2022-10-31 DIAGNOSIS — Z006 Encounter for examination for normal comparison and control in clinical research program: Secondary | ICD-10-CM

## 2022-11-04 DIAGNOSIS — Z713 Dietary counseling and surveillance: Secondary | ICD-10-CM | POA: Diagnosis not present

## 2022-11-04 DIAGNOSIS — E669 Obesity, unspecified: Secondary | ICD-10-CM | POA: Diagnosis not present

## 2022-11-04 DIAGNOSIS — Z6822 Body mass index (BMI) 22.0-22.9, adult: Secondary | ICD-10-CM | POA: Diagnosis not present

## 2022-11-07 ENCOUNTER — Other Ambulatory Visit (HOSPITAL_COMMUNITY): Payer: Self-pay

## 2022-11-07 MED ORDER — ZEPBOUND 10 MG/0.5ML ~~LOC~~ SOAJ
10.0000 mg | SUBCUTANEOUS | 0 refills | Status: DC
Start: 2022-11-04 — End: 2023-03-25
  Filled 2022-11-07: qty 2, 28d supply, fill #0

## 2022-11-10 ENCOUNTER — Other Ambulatory Visit (HOSPITAL_COMMUNITY): Payer: Self-pay

## 2022-11-11 ENCOUNTER — Other Ambulatory Visit (HOSPITAL_COMMUNITY): Payer: Self-pay

## 2022-11-12 ENCOUNTER — Other Ambulatory Visit (HOSPITAL_COMMUNITY): Payer: Self-pay

## 2022-12-16 ENCOUNTER — Encounter: Payer: Self-pay | Admitting: Pharmacist

## 2022-12-16 ENCOUNTER — Other Ambulatory Visit (HOSPITAL_COMMUNITY): Payer: Self-pay

## 2022-12-16 ENCOUNTER — Other Ambulatory Visit: Payer: Self-pay

## 2022-12-17 ENCOUNTER — Other Ambulatory Visit: Payer: Self-pay

## 2023-01-07 ENCOUNTER — Other Ambulatory Visit (HOSPITAL_COMMUNITY): Payer: Self-pay

## 2023-03-25 ENCOUNTER — Other Ambulatory Visit (HOSPITAL_COMMUNITY): Payer: Self-pay

## 2023-03-25 ENCOUNTER — Ambulatory Visit
Admission: EM | Admit: 2023-03-25 | Discharge: 2023-03-25 | Disposition: A | Discharge status: Home / Self Care | Payer: 59 | Attending: Family Medicine | Admitting: Family Medicine

## 2023-03-25 ENCOUNTER — Ambulatory Visit: Payer: 59

## 2023-03-25 ENCOUNTER — Encounter: Payer: Self-pay | Admitting: Emergency Medicine

## 2023-03-25 DIAGNOSIS — J4521 Mild intermittent asthma with (acute) exacerbation: Secondary | ICD-10-CM | POA: Diagnosis not present

## 2023-03-25 DIAGNOSIS — R509 Fever, unspecified: Secondary | ICD-10-CM | POA: Diagnosis not present

## 2023-03-25 DIAGNOSIS — J069 Acute upper respiratory infection, unspecified: Secondary | ICD-10-CM | POA: Diagnosis not present

## 2023-03-25 DIAGNOSIS — R058 Other specified cough: Secondary | ICD-10-CM | POA: Diagnosis not present

## 2023-03-25 DIAGNOSIS — R0789 Other chest pain: Secondary | ICD-10-CM | POA: Diagnosis not present

## 2023-03-25 LAB — POCT INFLUENZA A/B
Influenza A, POC: NEGATIVE
Influenza B, POC: NEGATIVE

## 2023-03-25 MED ORDER — PROMETHAZINE-DM 6.25-15 MG/5ML PO SYRP
5.0000 mL | ORAL_SOLUTION | Freq: Four times a day (QID) | ORAL | 0 refills | Status: AC | PRN
  Filled 2023-03-25: qty 100, 5d supply, fill #0

## 2023-03-25 MED ORDER — ALBUTEROL SULFATE HFA 108 (90 BASE) MCG/ACT IN AERS
2.0000 | INHALATION_SPRAY | RESPIRATORY_TRACT | 0 refills | Status: AC | PRN
  Filled 2023-03-25: qty 6.7, 25d supply, fill #0

## 2023-03-25 MED ORDER — DEXAMETHASONE SODIUM PHOSPHATE 10 MG/ML IJ SOLN
10.0000 mg | Freq: Once | INTRAMUSCULAR | Status: AC
  Administered 2023-03-25: 10 mg via INTRAMUSCULAR

## 2023-03-25 NOTE — ED Triage Notes (Signed)
Productive Cough, fever, since Monday.  home covid test was negative on Monday.  States throat is sore and hard to talk.  Has been taking mucinex and nyquil.

## 2023-03-30 ENCOUNTER — Other Ambulatory Visit (HOSPITAL_COMMUNITY): Payer: Self-pay

## 2023-03-30 DIAGNOSIS — Z1389 Encounter for screening for other disorder: Secondary | ICD-10-CM | POA: Diagnosis not present

## 2023-03-30 DIAGNOSIS — Z30431 Encounter for routine checking of intrauterine contraceptive device: Secondary | ICD-10-CM | POA: Diagnosis not present

## 2023-03-30 DIAGNOSIS — Z01411 Encounter for gynecological examination (general) (routine) with abnormal findings: Secondary | ICD-10-CM | POA: Diagnosis not present

## 2023-03-30 DIAGNOSIS — Z130 Encounter for screening for diseases of the blood and blood-forming organs and certain disorders involving the immune mechanism: Secondary | ICD-10-CM | POA: Diagnosis not present

## 2023-03-30 DIAGNOSIS — Z01419 Encounter for gynecological examination (general) (routine) without abnormal findings: Secondary | ICD-10-CM | POA: Diagnosis not present

## 2023-03-30 DIAGNOSIS — F329 Major depressive disorder, single episode, unspecified: Secondary | ICD-10-CM | POA: Diagnosis not present

## 2023-03-30 MED ORDER — SERTRALINE HCL 100 MG PO TABS
100.0000 mg | ORAL_TABLET | Freq: Every day | ORAL | 5 refills | Status: AC
  Filled 2023-03-30: qty 90, 90d supply, fill #0
  Filled 2023-07-06: qty 90, 90d supply, fill #1
  Filled 2023-10-08: qty 90, 90d supply, fill #2

## 2023-03-30 NOTE — ED Provider Notes (Signed)
[facility]    CSN: [id number] Arrival date & time: 03/25/23  0940      History   Chief Complaint No chief complaint on file.   HPI Alyssa Guerra Alyssa Guerra is a 37 y.o. female.   Patient presenting today with a 5-day history of fever, productive cough, sore throat, hoarseness, chest tightness.  Denies chest pain, significant shortness of breath, abdominal pain, nausea vomiting or diarrhea.  Taking Mucinex and NyQuil with minimal relief.  History of asthma but states she has not required inhaler in quite some time.  No known sick contacts recently.    Past Medical History:  Diagnosis Date   Asthma    Heartburn in pregnancy    S/P cesarean section 06/23/2013   S/P cesarean section 06/23/2013   Seasonal allergies     Patient Active Problem List   Diagnosis Date Noted   Asthma 07/28/2017   Seasonal allergies 07/28/2017   Anxiety 07/28/2017    Past Surgical History:  Procedure Laterality Date   CESAREAN SECTION  02/15/2011   Procedure: CESAREAN SECTION;  Surgeon: Bing Plume, MD;  Location: [facility];  Service: Gynecology;  Laterality: N/A;  primary of baby boy  at 2054  APGAR 8/9   CESAREAN SECTION N/A 06/23/2013   Procedure: REPEAT CESAREAN SECTION;  Surgeon: Sherron Monday, MD;  Location: [facility];  Service: Obstetrics;  Laterality: N/A;  1 1/2hrs OR time   ETHMOIDECTOMY Bilateral 07/27/2020   Procedure: ETHMOIDECTOMY;  Surgeon: Newman Pies, MD;  Location: [facility];  Service: ENT;  Laterality: Bilateral;   MAXILLARY ANTROSTOMY Bilateral 07/27/2020   Procedure: MAXILLARY ANTROSTOMY AND TISSUE REMOVAL;  Surgeon: Newman Pies, MD;  Location: [facility];  Service: ENT;  Laterality: Bilateral;   SEPTOPLASTY N/A 07/27/2020   Procedure: SEPTOPLASTY;  Surgeon: Newman Pies, MD;  Location: [facility];  Service: ENT;  Laterality: N/A;   SINUS ENDO WITH FUSION Bilateral 07/27/2020   Procedure: SINUS ENDO WITH FUSION;  Surgeon: Newman Pies, MD;  Location:  [facility];  Service: ENT;  Laterality: Bilateral;   SINUS EXPLORATION Bilateral 07/27/2020   Procedure: FRONTAL SINUS EXPLORATION;  Surgeon: Newman Pies, MD;  Location: [facility];  Service: ENT;  Laterality: Bilateral;   SPHENOIDECTOMY Bilateral 07/27/2020   Procedure: SPHENOIDECTOMY WITH TISSUE REMOVAL;  Surgeon: Newman Pies, MD;  Location: [facility];  Service: ENT;  Laterality: Bilateral;   WISDOM TOOTH EXTRACTION  04/20/2017    OB History     Gravida  2   Para  2   Term  2   Preterm      AB      Living  2      SAB  0   IAB  0   Ectopic      Multiple      Live Births  2            Home Medications    Prior to Admission medications   Medication Sig Start Date End Date Taking? Authorizing Provider  albuterol (VENTOLIN HFA) 108 (90 Base) MCG/ACT inhaler Inhale 2 puffs into the lungs every 4 (four) hours as needed. 03/25/23  Yes Particia Nearing, PA-C  promethazine-dextromethorphan (PROMETHAZINE-DM) 6.25-15 MG/5ML syrup Take 5 mLs by mouth 4 (four) times daily as needed. 03/25/23  Yes Particia Nearing, PA-C  Insulin Pen Needle 31G X 6 MM MISC Use 1 pen needle daily as directed 03/28/21     Insulin Pen  Needle 31G X 6 MM MISC Use one pen needle daily as directed. 03/18/21   Neta Mends, CNM  levonorgestrel (MIRENA, 52 MG,) 20 MCG/24HR IUD 1 Intra Uterine Device (1 each total) by Intrauterine route once for 1 dose. 03/21/19 12/30/19  Salley Scarlet, MD  montelukast (SINGULAIR) 10 MG tablet Take 1 tablet (10 mg total) by mouth at bedtime. 12/14/18   [place], Velna Hatchet, MD  montelukast (SINGULAIR) 10 MG tablet Take 1 tablet (10 mg total) by mouth at bedtime. 12/24/21     sertraline (ZOLOFT) 100 MG tablet TAKE 1 TABLET BY MOUTH ONCE DAILY 02/03/20 02/02/21  Bovard-Stuckert, Augusto Gamble, MD  sertraline (ZOLOFT) 100 MG tablet Take 1 tablet (100 mg total) by mouth daily. 12/27/21     sertraline (ZOLOFT) 100 MG tablet Take 1 tablet (100  mg total) by mouth daily. 03/04/22       Family History Family History  Problem Relation Age of Onset   CAD Mother    Diabetes Maternal Grandmother    Cancer - Ovarian Maternal Grandmother        ovarian? cervical?   CVA Maternal Grandmother    Diabetes Maternal Grandfather     Social History Social History   Tobacco Use   Smoking status: Never   Smokeless tobacco: Never  Substance Use Topics   Alcohol use: Yes    Alcohol/week: 2.0 standard drinks of alcohol    Types: 1 Cans of beer, 1 Glasses of wine per week    Comment: none with pregnancy   Drug use: No     Allergies   Other   Review of Systems Review of Systems Per HPI  Physical Exam Triage Vital Signs ED Triage Vitals  Encounter Vitals Group     BP 03/25/23 0947 119/72     Systolic BP Percentile --      Diastolic BP Percentile --      Pulse Rate 03/25/23 0947 (!) 119     Resp 03/25/23 0947 20     Temp 03/25/23 0947 99.9 F (37.7 C)     Temp Source 03/25/23 0947 Oral     SpO2 03/25/23 0947 98 %     Weight --      Height --      Head Circumference --      Peak Flow --      Pain Score 03/25/23 0949 3     Pain Loc --      Pain Education --      Exclude from Growth Chart --    No data found.  Updated Vital Signs BP 119/72 (BP Location: Right Arm)   Pulse (!) 119   Temp 99.9 F (37.7 C) (Oral)   Resp 20   SpO2 98%   Visual Acuity Right Eye Distance:   Left Eye Distance:   Bilateral Distance:    Right Eye Near:   Left Eye Near:    Bilateral Near:     Physical Exam Vitals and nursing note reviewed.  Constitutional:      Appearance: Normal appearance.  HENT:     Head: Atraumatic.     Right Ear: Tympanic membrane and external ear normal.     Left Ear: Tympanic membrane and external ear normal.     Nose: Congestion present.     Mouth/Throat:     Mouth: Mucous membranes are moist.     Pharynx: Posterior oropharyngeal erythema present.  Eyes:     Extraocular Movements: Extraocular  movements intact.  Conjunctiva/sclera: Conjunctivae normal.  Cardiovascular:     Rate and Rhythm: Normal rate and regular rhythm.     Heart sounds: Normal heart sounds.  Pulmonary:     Effort: Pulmonary effort is normal.     Breath sounds: Wheezing present.  Musculoskeletal:        General: Normal range of motion.     Cervical back: Normal range of motion and neck supple.  Skin:    General: Skin is warm and dry.  Neurological:     Mental Status: She is alert and oriented to person, place, and time.  Psychiatric:        Mood and Affect: Mood normal.        Thought Content: Thought content normal.      UC Treatments / Results  Labs (all labs ordered are listed, but only abnormal results are displayed) Labs Reviewed  POCT INFLUENZA A/B    EKG   Radiology No results found.  Procedures Procedures (including critical care time)  Medications Ordered in UC Medications  dexamethasone (DECADRON) injection 10 mg (10 mg Intramuscular Given 03/25/23 1156)    Initial Impression / Assessment and Plan / UC Course  I have reviewed the triage vital signs and the nursing notes.  Pertinent labs & imaging results that were available during my care of the patient were reviewed by me and considered in my medical decision making (see chart for details).     Low-grade fever and tachycardia in triage, otherwise vital signs reassuring.  She is overall well-appearing and in no acute distress.  Rapid flu is negative, chest x-ray negative for pneumonia.  Will treat for asthma exacerbation and viral URI with IM Decadron, albuterol, Phenergan DM.  Discussed supportive over-the-counter medications and home care.  Return for worsening symptoms.  Final Clinical Impressions(s) / UC Diagnoses   Final diagnoses:  Viral URI with cough  Mild intermittent asthma with acute exacerbation   Discharge Instructions   None    ED Prescriptions     Medication Sig Dispense Auth. Provider   albuterol  (VENTOLIN HFA) 108 (90 Base) MCG/ACT inhaler Inhale 2 puffs into the lungs every 4 (four) hours as needed. 6.7 g Particia Nearing, [place]   promethazine-dextromethorphan (PROMETHAZINE-DM) 6.25-15 MG/5ML syrup Take 5 mLs by mouth 4 (four) times daily as needed. 100 mL Particia Nearing, [place]      PDMP not reviewed this encounter.   Particia Nearing, [place] 03/30/23 1324

## 2023-04-02 DIAGNOSIS — Z1231 Encounter for screening mammogram for malignant neoplasm of breast: Secondary | ICD-10-CM | POA: Diagnosis not present

## 2023-04-08 ENCOUNTER — Other Ambulatory Visit (HOSPITAL_COMMUNITY): Payer: Self-pay

## 2023-04-09 ENCOUNTER — Other Ambulatory Visit (HOSPITAL_COMMUNITY): Payer: Self-pay

## 2023-04-09 MED ORDER — MONTELUKAST SODIUM 10 MG PO TABS
10.0000 mg | ORAL_TABLET | Freq: Every day | ORAL | 5 refills | Status: AC
  Filled 2023-04-09: qty 90, 90d supply, fill #0
  Filled 2023-07-06: qty 90, 90d supply, fill #1
  Filled 2023-08-22 – 2023-10-08 (×2): qty 90, 90d supply, fill #2

## 2023-05-26 DIAGNOSIS — H52223 Regular astigmatism, bilateral: Secondary | ICD-10-CM | POA: Diagnosis not present

## 2023-05-26 DIAGNOSIS — H5213 Myopia, bilateral: Secondary | ICD-10-CM | POA: Diagnosis not present

## 2023-05-26 DIAGNOSIS — Z135 Encounter for screening for eye and ear disorders: Secondary | ICD-10-CM | POA: Diagnosis not present

## 2023-07-06 ENCOUNTER — Other Ambulatory Visit (HOSPITAL_COMMUNITY): Payer: Self-pay

## 2023-07-27 ENCOUNTER — Other Ambulatory Visit (HOSPITAL_COMMUNITY): Payer: Self-pay

## 2023-07-27 DIAGNOSIS — F418 Other specified anxiety disorders: Secondary | ICD-10-CM | POA: Diagnosis not present

## 2023-07-27 DIAGNOSIS — Z6824 Body mass index (BMI) 24.0-24.9, adult: Secondary | ICD-10-CM | POA: Diagnosis not present

## 2023-07-27 DIAGNOSIS — Z713 Dietary counseling and surveillance: Secondary | ICD-10-CM | POA: Diagnosis not present

## 2023-07-27 DIAGNOSIS — E669 Obesity, unspecified: Secondary | ICD-10-CM | POA: Diagnosis not present

## 2023-07-27 MED ORDER — BUPROPION HCL ER (SR) 150 MG PO TB12
150.0000 mg | ORAL_TABLET | Freq: Two times a day (BID) | ORAL | 3 refills | Status: AC
  Filled 2023-07-27: qty 60, 30d supply, fill #0
  Filled 2023-08-22: qty 60, 30d supply, fill #1
  Filled 2023-09-29 – 2023-10-08 (×2): qty 60, 30d supply, fill #2

## 2023-07-27 MED ORDER — SERTRALINE HCL 25 MG PO TABS
25.0000 mg | ORAL_TABLET | Freq: Every day | ORAL | 0 refills | Status: AC
  Filled 2023-07-27: qty 30, 30d supply, fill #0

## 2023-08-23 ENCOUNTER — Other Ambulatory Visit (HOSPITAL_COMMUNITY): Payer: Self-pay

## 2023-08-24 ENCOUNTER — Other Ambulatory Visit: Payer: Self-pay

## 2023-09-30 ENCOUNTER — Other Ambulatory Visit: Payer: Self-pay

## 2023-09-30 ENCOUNTER — Encounter: Payer: Self-pay | Admitting: Pharmacist

## 2023-10-05 ENCOUNTER — Other Ambulatory Visit: Payer: Self-pay

## 2024-02-03 ENCOUNTER — Other Ambulatory Visit: Payer: Self-pay | Admitting: Medical Genetics

## 2024-02-03 DIAGNOSIS — Z006 Encounter for examination for normal comparison and control in clinical research program: Secondary | ICD-10-CM
